# Patient Record
Sex: Female | Born: 1970
Health system: Southern US, Community
[De-identification: ages and names within clinical notes are randomized; demographics above are authoritative.]

## PROBLEM LIST (undated history)

## (undated) DIAGNOSIS — E042 Nontoxic multinodular goiter: Secondary | ICD-10-CM

## (undated) DIAGNOSIS — K219 Gastro-esophageal reflux disease without esophagitis: Secondary | ICD-10-CM

## (undated) DIAGNOSIS — IMO0001 Reserved for inherently not codable concepts without codable children: Secondary | ICD-10-CM

## (undated) DIAGNOSIS — Z8669 Personal history of other diseases of the nervous system and sense organs: Secondary | ICD-10-CM

## (undated) DIAGNOSIS — R030 Elevated blood-pressure reading, without diagnosis of hypertension: Secondary | ICD-10-CM

## (undated) DIAGNOSIS — Z9889 Other specified postprocedural states: Secondary | ICD-10-CM

## (undated) HISTORY — DX: Gastro-esophageal reflux disease without esophagitis: K21.9

## (undated) HISTORY — DX: Elevated blood-pressure reading, without diagnosis of hypertension: R03.0

## (undated) HISTORY — PX: BREAST SURGERY: SHX581

## (undated) HISTORY — PX: OTHER SURGICAL HISTORY: SHX169

## (undated) HISTORY — DX: Personal history of other diseases of the nervous system and sense organs: Z86.69

## (undated) HISTORY — DX: Reserved for inherently not codable concepts without codable children: IMO0001

## (undated) HISTORY — DX: Nontoxic multinodular goiter: E04.2

---

## 2000-12-19 ENCOUNTER — Other Ambulatory Visit: Admission: RE | Admit: 2000-12-19 | Discharge: 2000-12-19 | Payer: Self-pay | Admitting: Obstetrics and Gynecology

## 2001-08-19 ENCOUNTER — Inpatient Hospital Stay (HOSPITAL_COMMUNITY): Admission: AD | Admit: 2001-08-19 | Discharge: 2001-08-19 | Payer: Self-pay | Admitting: Obstetrics and Gynecology

## 2001-08-23 ENCOUNTER — Inpatient Hospital Stay (HOSPITAL_COMMUNITY): Admission: AD | Admit: 2001-08-23 | Discharge: 2001-08-26 | Payer: Self-pay | Admitting: Obstetrics and Gynecology

## 2001-11-26 ENCOUNTER — Other Ambulatory Visit: Admission: RE | Admit: 2001-11-26 | Discharge: 2001-11-26 | Payer: Self-pay | Admitting: Obstetrics and Gynecology

## 2002-09-08 ENCOUNTER — Encounter: Payer: Self-pay | Admitting: Obstetrics and Gynecology

## 2002-09-08 ENCOUNTER — Encounter: Admission: RE | Admit: 2002-09-08 | Discharge: 2002-09-08 | Payer: Self-pay | Admitting: Obstetrics and Gynecology

## 2003-12-24 ENCOUNTER — Other Ambulatory Visit: Admission: RE | Admit: 2003-12-24 | Discharge: 2003-12-24 | Payer: Self-pay | Admitting: Obstetrics and Gynecology

## 2005-01-04 ENCOUNTER — Ambulatory Visit: Payer: Self-pay | Admitting: Internal Medicine

## 2005-12-25 ENCOUNTER — Encounter (INDEPENDENT_AMBULATORY_CARE_PROVIDER_SITE_OTHER): Payer: Self-pay | Admitting: Internal Medicine

## 2005-12-25 LAB — CONVERTED CEMR LAB

## 2006-10-01 ENCOUNTER — Ambulatory Visit: Payer: Self-pay | Admitting: Family Medicine

## 2006-10-01 LAB — CONVERTED CEMR LAB
ALT: 10 units/L (ref 0–40)
AST: 14 units/L (ref 0–37)
Albumin: 4 g/dL (ref 3.5–5.2)
Alkaline Phosphatase: 40 units/L (ref 39–117)
BUN: 6 mg/dL (ref 6–23)
Basophils Absolute: 0.1 10*3/uL (ref 0.0–0.1)
Basophils Relative: 0.9 % (ref 0.0–1.0)
Bilirubin, Direct: 0.1 mg/dL (ref 0.0–0.3)
CO2: 28 meq/L (ref 19–32)
Calcium: 9.2 mg/dL (ref 8.4–10.5)
Chloride: 107 meq/L (ref 96–112)
Cholesterol: 148 mg/dL (ref 0–200)
Creatinine, Ser: 1 mg/dL (ref 0.4–1.2)
Eosinophils Absolute: 0.1 10*3/uL (ref 0.0–0.6)
Eosinophils Relative: 1.2 % (ref 0.0–5.0)
GFR calc Af Amer: 82 mL/min
GFR calc non Af Amer: 68 mL/min
Glucose, Bld: 83 mg/dL (ref 70–99)
HCT: 37.3 % (ref 36.0–46.0)
HDL: 43.6 mg/dL (ref >39.0)
Hemoglobin: 12.4 g/dL (ref 12.0–15.0)
LDL Cholesterol: 97 mg/dL (ref 0–99)
Lymphocytes Relative: 31.9 % (ref 12.0–46.0)
MCHC: 33.3 g/dL (ref 30.0–36.0)
MCV: 82.9 fL (ref 78.0–100.0)
Monocytes Absolute: 0.4 10*3/uL (ref 0.2–0.7)
Monocytes Relative: 5 % (ref 3.0–11.0)
Neutro Abs: 5.1 10*3/uL (ref 1.4–7.7)
Neutrophils Relative %: 61 % (ref 43.0–77.0)
Platelets: 191 10*3/uL (ref 150–400)
Potassium: 4.1 meq/L (ref 3.5–5.1)
RBC: 4.5 M/uL (ref 3.87–5.11)
RDW: 12.8 % (ref 11.5–14.6)
Sodium: 139 meq/L (ref 135–145)
TSH: 0.96 microintl units/mL (ref 0.35–5.50)
Total Bilirubin: 1 mg/dL (ref 0.3–1.2)
Total CHOL/HDL Ratio: 3.4
Total Protein: 7.3 g/dL (ref 6.0–8.3)
Triglycerides: 39 mg/dL (ref 0–149)
VLDL: 8 mg/dL (ref 0–40)
WBC: 8.3 10*3/uL (ref 4.5–10.5)

## 2007-10-22 ENCOUNTER — Ambulatory Visit: Payer: Self-pay | Admitting: Internal Medicine

## 2007-10-23 ENCOUNTER — Ambulatory Visit: Payer: Self-pay | Admitting: Internal Medicine

## 2008-12-14 ENCOUNTER — Encounter (INDEPENDENT_AMBULATORY_CARE_PROVIDER_SITE_OTHER): Payer: Self-pay | Admitting: Internal Medicine

## 2008-12-14 DIAGNOSIS — Z8669 Personal history of other diseases of the nervous system and sense organs: Secondary | ICD-10-CM | POA: Insufficient documentation

## 2008-12-15 ENCOUNTER — Ambulatory Visit: Payer: Self-pay | Admitting: Family Medicine

## 2008-12-16 LAB — CONVERTED CEMR LAB
ALT: 13 units/L (ref 0–35)
AST: 17 units/L (ref 0–37)
Albumin: 3.9 g/dL (ref 3.5–5.2)
Alkaline Phosphatase: 48 units/L (ref 39–117)
BUN: 9 mg/dL (ref 6–23)
Basophils Absolute: 0.1 10*3/uL (ref 0.0–0.1)
Basophils Relative: 1 % (ref 0.0–3.0)
Bilirubin, Direct: 0.1 mg/dL (ref 0.0–0.3)
CO2: 29 meq/L (ref 19–32)
Calcium: 9.3 mg/dL (ref 8.4–10.5)
Chloride: 109 meq/L (ref 96–112)
Cholesterol: 164 mg/dL (ref 0–200)
Creatinine, Ser: 0.9 mg/dL (ref 0.4–1.2)
Eosinophils Absolute: 0.1 10*3/uL (ref 0.0–0.7)
Eosinophils Relative: 1.4 % (ref 0.0–5.0)
GFR calc non Af Amer: 91.42 mL/min (ref >60)
Glucose, Bld: 85 mg/dL (ref 70–99)
HCT: 39.4 % (ref 36.0–46.0)
HDL: 54.3 mg/dL (ref >39.00)
Hemoglobin: 12.6 g/dL (ref 12.0–15.0)
LDL Cholesterol: 100 mg/dL — ABNORMAL HIGH (ref 0–99)
Lymphocytes Relative: 36.5 % (ref 12.0–46.0)
Lymphs Abs: 2.4 10*3/uL (ref 0.7–4.0)
MCHC: 32.1 g/dL (ref 30.0–36.0)
MCV: 85.4 fL (ref 78.0–100.0)
Monocytes Absolute: 0.3 10*3/uL (ref 0.1–1.0)
Monocytes Relative: 4.3 % (ref 3.0–12.0)
Neutro Abs: 3.8 10*3/uL (ref 1.4–7.7)
Neutrophils Relative %: 56.8 % (ref 43.0–77.0)
Platelets: 158 10*3/uL (ref 150.0–400.0)
Potassium: 4.3 meq/L (ref 3.5–5.1)
RBC: 4.61 M/uL (ref 3.87–5.11)
RDW: 12.8 % (ref 11.5–14.6)
Sodium: 142 meq/L (ref 135–145)
TSH: 0.79 microintl units/mL (ref 0.35–5.50)
Total Bilirubin: 0.9 mg/dL (ref 0.3–1.2)
Total CHOL/HDL Ratio: 3
Total Protein: 7.3 g/dL (ref 6.0–8.3)
Triglycerides: 48 mg/dL (ref 0.0–149.0)
VLDL: 9.6 mg/dL (ref 0.0–40.0)
WBC: 6.7 10*3/uL (ref 4.5–10.5)

## 2011-01-12 NOTE — Assessment & Plan Note (Signed)
[facility]                           [facility] OFFICE NOTE   NAME:Monique Fletcher                        MRN:          [id number]  DATE:10/01/2006                            DOB:          07/21/1971    [name] is a 40 year old black female who comes to establish with  the practice due to constipation and recent slight weight gain of 3-5  pounds in the last year.  She indicates that she exercises on a regular  basis, but has continued to gain weight.   She also has had no primary care physician in years and is in need of  establishing.   CURRENT MEDICATIONS:  1. Benefiber 3 tablets b.i.d.  2. Dunc cream for acne.   ALLERGIES:  1. SULFUR causes and facial swelling.  2. FLAGYL causes nausea and vomiting.   PAST MEDICAL HISTORY:  1. She indicates that she was preeclamptic with elevated blood      pressure for 1 month at the end of her pregnancy.  This, however,      resolved after pregnancy was completed.  2. She has fibrocystic breast disease.  3. Migraines.  The last one was in 2002.   SURGERIES:  1. Two D&Cs, one in September of 2001 and one in January of 2002.  2. She has had wisdom teeth extracted.  3. A left breast mass removed in 1988 which was benign.   She has been hospitalized for surgery and childbirth.   SOCIAL HISTORY:  She is single and has 1 daughter, age 65.  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  She  does cardio 3-4 times a week.  Has never smoked.  Does not use alcohol.   REVIEW OF SYSTEMS:  HEENT:  Negative with the exception of 2 episodes of  syncope in April of 2003.  She had a complete work-up including EKG, 2-D  echocardiogram and MRI of the brain, which was negative.  She does have  a history of migraines, as discussed above.  She wears glasses for near-  sightedness.  Her last visit to the eye doctor was approximately 2 years  ago, which was negative for glaucoma and  cataracts.  CARDIORESPIRATORY:  She denies any cardiovascular or respiratory problem, with the exception  of hypertension during her pregnancy.  GI:  She has a constipation.  She  was seen by Dr. Lina Sar a number of years ago, and currently is  using Benefiber, which helps.  She has reflux per her ENT.  She was  previously started on AcipHex, but currently is taking nothing.  She was  worked up for a lump in her throat.  She has had no transfusions.  She  has a tattoo on her left shoulder.  She had HIV testing for insurance  purposes in October of 2006, which was negative.  GU:  She has no  history of renal stones, but does have a history of frequent urinary  tract infections until the last year when she has only had one.  GYNECOLOGIC:  She is a gravida 1, para 1, with her last Pap smear done  May of 2007 by Dr. Billy Coast, whom she sees on a regular basis.  She had an  ultrasound of her breasts in 2004 due to her markedly fibrocystic  changes.  Eclampsia/preeclampsia as discussed above.  MUSCULOSKELETAL:  She has pain in her ankles and knees with exercise.  Has had no  fractures.  She does have allergic rhinitis and uses either Benadryl or  Zyrtec.  She has had no thromboses.   FAMILY HISTORY:  Father is living at age 81, having had a partial  colectomy due to diverticulum.  Mother is living at age 74 with no known  medical problems.  She has 1 brother living and well.   With questions regarding the extended family, it was found that her  paternal grandmother had a stroke.  Her maternal grandmother had  hypertension, and her maternal grandfather died of a stroke.  Her  paternal grandmother has diabetes.  Her paternal great-grandmother had  breast cancer, but to her knowledge, there is no further cancer in the  family.  Her paternal grandmother had depression.  There is no drug or  alcohol abuse in the family.   IMMUNIZATION RECORD:  Her last tetanus was in 2005.  She has had the   hepatitis B vaccine, but never had the pneumonia vaccine.   PHYSICAL EXAMINATION:  VITAL SIGNS:  Blood pressure 107/72, temperature  98.2, pulse 68, platelets 135, height 5 feet.  GENERAL:  Well-developed, well-nourished, black female in no acute  distress.  CHEST:  Clear throughout.  No rales, rhonchi or wheezes.  HEART:  Rate and rhythm regular without murmurs, gallops, or rubs.  No  carotid bruits.  No pretibial edema.  MUSCULOSKELETAL:  Muscles masses equal in upper and lower extremities.  Gait and station within normal limits.  Strength is 5/5 in upper and  lower extremities.  SKIN:  Without obvious lesion in exposed areas.  PSYCHIATRIC:  Oriented x3.  Verbalizes very easily.   ASSESSMENT:  1. History of constipation, currently fairly well controlled on her      Benefiber.  I have asked her to increase her water or juice in      order to keep bowel movements soft.  She generally has 1 bowel      movement 2-3 times a week.  2. Slight weight gain over the past year.  Reviewed with her reduction      in calories (i.e. processed foods).  Will get a [place] panel,      which is non-fasting, today and notify of      results.  3. Schedule in for CPX.      Billie D. Bean, FNP  Electronically Signed      Arta Silence, MD  Electronically Signed   BDB/MedQ  DD: 10/02/2006  DT: 10/02/2006  Job #: [id number]

## 2013-06-03 ENCOUNTER — Emergency Department (HOSPITAL_COMMUNITY): Payer: Self-pay

## 2013-06-03 ENCOUNTER — Emergency Department (HOSPITAL_COMMUNITY)
Admission: EM | Admit: 2013-06-03 | Discharge: 2013-06-04 | Disposition: A | Discharge status: Home / Self Care | Payer: Self-pay | Attending: Emergency Medicine | Admitting: Emergency Medicine

## 2013-06-03 ENCOUNTER — Encounter (HOSPITAL_COMMUNITY): Payer: Self-pay | Admitting: Emergency Medicine

## 2013-06-03 DIAGNOSIS — S81009A Unspecified open wound, unspecified knee, initial encounter: Secondary | ICD-10-CM | POA: Insufficient documentation

## 2013-06-03 DIAGNOSIS — Y9302 Activity, running: Secondary | ICD-10-CM | POA: Insufficient documentation

## 2013-06-03 DIAGNOSIS — S8990XA Unspecified injury of unspecified lower leg, initial encounter: Secondary | ICD-10-CM | POA: Insufficient documentation

## 2013-06-03 DIAGNOSIS — M25562 Pain in left knee: Secondary | ICD-10-CM

## 2013-06-03 DIAGNOSIS — Y9289 Other specified places as the place of occurrence of the external cause: Secondary | ICD-10-CM | POA: Insufficient documentation

## 2013-06-03 DIAGNOSIS — W010XXA Fall on same level from slipping, tripping and stumbling without subsequent striking against object, initial encounter: Secondary | ICD-10-CM | POA: Insufficient documentation

## 2013-06-03 DIAGNOSIS — T07XXXA Unspecified multiple injuries, initial encounter: Secondary | ICD-10-CM

## 2013-06-03 DIAGNOSIS — Z23 Encounter for immunization: Secondary | ICD-10-CM | POA: Insufficient documentation

## 2013-06-03 DIAGNOSIS — S81012A Laceration without foreign body, left knee, initial encounter: Secondary | ICD-10-CM

## 2013-06-03 DIAGNOSIS — W19XXXA Unspecified fall, initial encounter: Secondary | ICD-10-CM

## 2013-06-03 DIAGNOSIS — Z79899 Other long term (current) drug therapy: Secondary | ICD-10-CM | POA: Insufficient documentation

## 2013-06-03 DIAGNOSIS — IMO0002 Reserved for concepts with insufficient information to code with codable children: Secondary | ICD-10-CM | POA: Insufficient documentation

## 2013-06-03 MED ORDER — TETANUS-DIPHTH-ACELL PERTUSSIS 5-2.5-18.5 LF-MCG/0.5 IM SUSP
0.5000 mL | Freq: Once | INTRAMUSCULAR | Status: AC
  Administered 2013-06-03: 0.5 mL via INTRAMUSCULAR
  Filled 2013-06-03: qty 0.5

## 2013-06-03 NOTE — ED Notes (Addendum)
Presents post fall while running, denies syncope. Abrasion to right side of chin, left knee avulsion and abrasion, bleeding controlled, CMS intact. Tetanus not up to date. No deformity to knee.  Denies neck pain, denies headache

## 2013-06-03 NOTE — ED Provider Notes (Signed)
CSN: [id number]     Arrival date & time 06/03/13  2022 History   First MD Initiated Contact with Patient 06/03/13 2246     Chief Complaint  Patient presents with  . Fall   (Consider location/radiation/quality/duration/timing/severity/associated sxs/prior Treatment) HPI Comments: Monique Fletcher is a 42 y.o. female  with no known medical history presents to the Emergency Department complaining of acute, persistent, laceration to the left knee with associated abrasions and swelling to the left knee onset 745 this evening. Patient reports she was "racing" her daughter in the parking lot and tripped and fell. She states she landed on her left knee first, cough herself with her hands and then struck her face on the ground. She reports she did not have a loss of consciousness. She denies broken teeth, headache, neck pain, back pain. She states she was ambulatory immediately afterwards without difficulty. After she arrived home and look at her wounds she was concerned of a laceration on the left knee which needed stitches, bringing her into the department.  Walking makes the pain slightly worse and she has not tried any over-the-counter treatments to alleviate the pain.  Patient rates her pain as mild.  She denies, chills, nausea, vomiting.  Patient is a 42 y.o. female presenting with fall. The history is provided by the patient and medical records. No language interpreter was used.  Fall This is a new problem. The current episode started today. The problem occurs rarely. The problem has been unchanged. Associated symptoms include arthralgias. Pertinent negatives include no fever, joint swelling, nausea, neck pain, numbness, vomiting or weakness. The symptoms are aggravated by bending. She has tried nothing for the symptoms.    History reviewed. No pertinent past medical history. History reviewed. No pertinent past surgical history. History reviewed. No pertinent family history. History  Substance Use  Topics  . Smoking status: Never Smoker   . Smokeless tobacco: Not on file  . Alcohol Use: Yes   OB History   Grav Para Term Preterm Abortions TAB SAB Ect Mult Living                 Review of Systems  Constitutional: Negative for fever.  HENT: Negative for dental problem, facial swelling and nosebleeds.   Gastrointestinal: Negative for nausea and vomiting.  Musculoskeletal: Positive for arthralgias. Negative for back pain, gait problem, joint swelling, neck pain and neck stiffness.  Skin: Positive for wound.  Allergic/Immunologic: Negative for immunocompromised state.  Neurological: Negative for weakness and numbness.  Hematological: Does not bruise/bleed easily.  Psychiatric/Behavioral: The patient is not nervous/anxious.     Allergies  Cephalexin; Metronidazole; and Sulfonamide derivatives  Home Medications   Current Outpatient Rx  Name  Route  Sig  Dispense  Refill  . fexofenadine (ALLEGRA) 180 MG tablet   Oral   Take 180 mg by mouth daily.         Marland Kitchen ibuprofen (ADVIL,MOTRIN) 200 MG tablet   Oral   Take 800 mg by mouth every 6 (six) hours as needed for pain.         . Multiple Vitamin (MULTIVITAMIN WITH MINERALS) TABS tablet   Oral   Take 1 tablet by mouth daily.          BP 124/66  Pulse 96  Temp(Src) 98.1 F (36.7 C) (Oral)  Resp 18  Wt 136 lb 5 oz (61.831 kg)  BMI 25.34 kg/m2  SpO2 100%  LMP 05/17/2013 Physical Exam  Nursing note and vitals reviewed. Constitutional: She  is oriented to person, place, and time. She appears well-developed and well-nourished. No distress.  HENT:  Head: Normocephalic.    Right Ear: Hearing, tympanic membrane, external ear and ear canal normal.  Left Ear: Hearing, tympanic membrane, external ear and ear canal normal.  Nose: Nose normal. No mucosal edema or rhinorrhea.  Mouth/Throat: Uvula is midline, oropharynx is clear and moist and mucous membranes are normal. Mucous membranes are not dry. Normal dentition. No  oropharyngeal exudate, posterior oropharyngeal edema, posterior oropharyngeal erythema or tonsillar abscesses.  No broken teeth Abrasion to the chin and right cheek  Eyes: Conjunctivae and EOM are normal. Pupils are equal, round, and reactive to light. No scleral icterus.  Neck: Normal range of motion and full passive range of motion without pain. No spinous process tenderness and no muscular tenderness present. No rigidity. Normal range of motion present.  Cardiovascular: Normal rate, regular rhythm, normal heart sounds and intact distal pulses.   No murmur heard. Capillary refill < 3 sec in the bilateral lower extremities  Pulmonary/Chest: Effort normal and breath sounds normal. No respiratory distress. She has no wheezes.  Musculoskeletal: Normal range of motion. She exhibits no edema.       Left knee: She exhibits ecchymosis and laceration. She exhibits normal range of motion, no swelling, no effusion, no deformity, no erythema, normal alignment and no LCL laxity. No tenderness found. No medial joint line and no lateral joint line tenderness noted.       Cervical back: Normal.       Thoracic back: Normal.       Lumbar back: Normal.  ROM: Full range of motion of the left hip, knee and ankle No joint line tenderness of the left knee  Lymphadenopathy:    She has no cervical adenopathy.  Neurological: She is alert and oriented to person, place, and time. She exhibits normal muscle tone. Coordination normal.  Sensation: Intact to dull and sharp in the bilateral lower extremities Strength: 5 out of 5 in the bilateral lower extremities including dorsiflexion and plantar flexion  Skin: Skin is warm and dry. She is not diaphoretic.  Abrasion to the left foot, bilateral hands and chin 3 x 3 cm laceration/avulsion to the left knee; no involvement of the joint capsule  Psychiatric: She has a normal mood and affect.    ED Course  LACERATION REPAIR Date/Time: 06/03/2013 11:36 PM Performed by:  Dierdre Forth Authorized by: Dierdre Forth Consent: Verbal consent obtained. Risks and benefits: risks, benefits and alternatives were discussed Consent given by: patient Patient understanding: patient states understanding of the procedure being performed Patient consent: the patient's understanding of the procedure matches consent given Procedure consent: procedure consent matches procedure scheduled Relevant documents: relevant documents present and verified Site marked: the operative site was marked Imaging studies: imaging studies available Required items: required blood products, implants, devices, and special equipment available Patient identity confirmed: verbally with patient and arm band Time out: Immediately prior to procedure a "time out" was called to verify the correct patient, procedure, equipment, support staff and site/side marked as required. Body area: lower extremity Location details: left knee Laceration length: 3 cm Contamination: The wound is contaminated. Foreign bodies: no foreign bodies Tendon involvement: none Nerve involvement: none Vascular damage: no Anesthesia: local infiltration Local anesthetic: lidocaine 2% with epinephrine Anesthetic total: 8 ml Patient sedated: no Preparation: Patient was prepped and draped in the usual sterile fashion. Irrigation solution: saline Irrigation method: syringe Amount of cleaning: extensive Debridement: moderate Degree of undermining:  none Skin closure: 3-0 Prolene Number of sutures: 3 Technique: horizontal mattress Approximation: close Approximation difficulty: complex Dressing: 4x4 sterile gauze and antibiotic ointment Patient tolerance: Patient tolerated the procedure well with no immediate complications.   (including critical care time) Labs Review Labs Reviewed - No data to display Imaging Review Dg Knee Complete 4 Views Left  06/03/2013   *RADIOLOGY REPORT*  Clinical Data: Status post  fall; left knee pain and abrasion.  LEFT KNEE - COMPLETE 4+ VIEW  Comparison: None.  Findings: There is no evidence of fracture or dislocation.  The joint spaces are preserved.  No significant degenerative change is seen; the patellofemoral joint is grossly unremarkable in appearance.  A fabella is noted.  No significant joint effusion is seen.  The visualized soft tissues are normal in appearance.  IMPRESSION: No evidence of fracture or dislocation.   Original Report Authenticated By: Tonia Ghent, M.D.    MDM   1. Laceration of left knee, initial encounter   2. Arthralgia of left knee   3. Abrasions of multiple sites   4. Fall while running, initial encounter      Monique Fletcher presents after fall with left knee pain and laceration.  Tdap booster given. Pressure irrigation performed along with debriedment. Laceration occurred < 8 hours prior to repair which was well tolerated. Pt has no co morbidities to effect normal wound healing. Discussed suture home care w pt and answered questions. Pt to f-u for wound check and suture removal in 7 days. Pt is hemodynamically stable w no complaints prior to dc.    Patient X-Ray of the left knee negative for obvious fracture or dislocation. I personally reviewed the imaging tests through PACS system.  I reviewed available ER/hospitalization records through the EMR.  Pt advised to follow up with orthopedics if symptoms persist for possibility of missed fracture diagnosis. Conservative therapy recommended and discussed. Patient will be dc home & is agreeable with above plan.  It has been determined that no acute conditions requiring further emergency intervention are present at this time. The patient/guardian have been advised of the diagnosis and plan. We have discussed signs and symptoms that warrant return to the ED, such as changes or worsening in symptoms.   Vital signs are stable at discharge.   BP 124/66  Pulse 96  Temp(Src) 98.1 F (36.7 C) (Oral)   Resp 18  Wt 136 lb 5 oz (61.831 kg)  BMI 25.34 kg/m2  SpO2 100%  LMP 05/17/2013  Patient/guardian has voiced understanding and agreed to follow-up with the PCP or specialist.    I personally performed the services described in this documentation, which was scribed in my presence. The recorded information has been reviewed and is accurate.      Dahlia Client Vici Novick, PA-C 06/03/13 2348

## 2013-06-03 NOTE — ED Provider Notes (Signed)
Medical screening examination/treatment/procedure(s) were performed by non-physician practitioner and as supervising physician I was immediately available for consultation/collaboration.   Charles B. Bernette Mayers, MD 06/03/13 2349

## 2013-06-13 ENCOUNTER — Emergency Department (INDEPENDENT_AMBULATORY_CARE_PROVIDER_SITE_OTHER)
Admission: EM | Admit: 2013-06-13 | Discharge: 2013-06-13 | Disposition: A | Discharge status: Home / Self Care | Payer: Self-pay | Source: Home / Self Care

## 2013-06-13 ENCOUNTER — Encounter (HOSPITAL_COMMUNITY): Payer: Self-pay | Admitting: Emergency Medicine

## 2013-06-13 DIAGNOSIS — Z4802 Encounter for removal of sutures: Secondary | ICD-10-CM

## 2013-06-13 NOTE — ED Provider Notes (Signed)
CSN: [id number]     Arrival date & time 06/13/13  0901 History   First MD Initiated Contact with Patient 06/13/13 [phone]     Chief Complaint  Patient presents with  . Wound Check   (Consider location/radiation/quality/duration/timing/severity/associated sxs/prior Treatment) HPI Comments: 42 year old female here for suture removal from a jagged laceration to the left knee approximately 10 days ago.   History reviewed. No pertinent past medical history. History reviewed. No pertinent past surgical history. No family history on file. History  Substance Use Topics  . Smoking status: Never Smoker   . Smokeless tobacco: Not on file  . Alcohol Use: Yes   OB History   Grav Para Term Preterm Abortions TAB SAB Ect Mult Living                 Review of Systems  All other systems reviewed and are negative.    Allergies  Cephalexin; Metronidazole; and Sulfonamide derivatives  Home Medications   Current Outpatient Rx  Name  Route  Sig  Dispense  Refill  . fexofenadine (ALLEGRA) 180 MG tablet   Oral   Take 180 mg by mouth daily.         Marland Kitchen ibuprofen (ADVIL,MOTRIN) 200 MG tablet   Oral   Take 800 mg by mouth every 6 (six) hours as needed for pain.         . Multiple Vitamin (MULTIVITAMIN WITH MINERALS) TABS tablet   Oral   Take 1 tablet by mouth daily.          BP 112/74  Pulse 82  Temp(Src) 98.2 F (36.8 C) (Oral)  SpO2 100%  LMP 05/17/2013 Physical Exam  Nursing note and vitals reviewed. Constitutional: She is oriented to person, place, and time. She appears well-developed and well-nourished. No distress.  Neurological: She is alert and oriented to person, place, and time. She exhibits normal muscle tone.  Skin: Skin is warm and dry.  She has been applying Neosporin ointment on a daily basis. There is a approximately 3 cm x 3 cm area of pink tissue which is now scabbed over yet. There are 3 retaining Prolene sutures. No signs of infection. No redness, no drainage,     ED Course  SUTURE REMOVAL Date/Time: 06/13/2013 9:46 AM Performed by: Phineas Real, DAVID Authorized by: Clementeen Graham, S Consent: Verbal consent obtained. Risks and benefits: risks, benefits and alternatives were discussed Consent given by: patient Patient understanding: patient states understanding of the procedure being performed Body area: lower extremity Location details: left knee Wound Appearance: clean, pink and moist Sutures Removed: 3 Post-removal: dressing applied Patient tolerance: Patient tolerated the procedure well with no immediate complications.   (including critical care time) Labs Review Labs Reviewed - No data to display Imaging Review No results found.    MDM   1. Visit for suture removal    All 3 sutures are moved and intact. The patient may clean with soap and water but can make sure the wound is dry. Do not apply any more ointments or creams. All L. wound to scab over and heal. For any development of signs of infection recheck promptly. Dressing is applied.  Hayden Rasmussen, NP 06/13/13 [phone]  Medical screening examination/treatment/procedure(s) were performed by a resident physician or non-physician practitioner and as the supervising physician I was immediately available for consultation/collaboration.  Clementeen Graham, MD    Rodolph Bong, MD 06/15/13 [phone]

## 2013-06-13 NOTE — ED Notes (Signed)
Seen by Hayden Rasmussen np

## 2014-04-02 ENCOUNTER — Encounter (INDEPENDENT_AMBULATORY_CARE_PROVIDER_SITE_OTHER): Payer: Self-pay

## 2014-04-02 ENCOUNTER — Ambulatory Visit (INDEPENDENT_AMBULATORY_CARE_PROVIDER_SITE_OTHER): Payer: 59 | Admitting: Family Medicine

## 2014-04-02 ENCOUNTER — Encounter: Payer: Self-pay | Admitting: Family Medicine

## 2014-04-02 VITALS — BP 100/70 | HR 70 | Temp 98.3°F | Ht 61.5 in | Wt 141.5 lb

## 2014-04-02 DIAGNOSIS — K219 Gastro-esophageal reflux disease without esophagitis: Secondary | ICD-10-CM

## 2014-04-02 DIAGNOSIS — Z13220 Encounter for screening for lipoid disorders: Secondary | ICD-10-CM

## 2014-04-02 DIAGNOSIS — G43909 Migraine, unspecified, not intractable, without status migrainosus: Secondary | ICD-10-CM

## 2014-04-02 DIAGNOSIS — E041 Nontoxic single thyroid nodule: Secondary | ICD-10-CM

## 2014-04-02 DIAGNOSIS — E042 Nontoxic multinodular goiter: Secondary | ICD-10-CM | POA: Insufficient documentation

## 2014-04-02 NOTE — Progress Notes (Signed)
Pre visit review using our clinic review tool, if applicable. No additional management support is needed unless otherwise documented below in the visit note. 

## 2014-04-02 NOTE — Patient Instructions (Signed)
Schedule fasting labs in next week. Stop at front desk to set up thyroid US. Continue working on healthy eating and regular exercise.

## 2014-04-02 NOTE — Assessment & Plan Note (Signed)
Resolved . None in last 10 years.

## 2014-04-02 NOTE — Assessment & Plan Note (Signed)
Palpated on exam over 1 year ago. No work up done. None felt now. Pt asymptomatic . Will eval TSH and send for thyroid US to eval.

## 2014-04-02 NOTE — Assessment & Plan Note (Signed)
No issues in last 13 years.

## 2014-04-02 NOTE — Progress Notes (Signed)
   Subjective:    Patient ID: Monique Fletcher, female    DOB: [birth year], 43 y.o.   MRN: [id number]  HPI  43 year old female presents to re-establish.  She saw Billie bean in past.   She sees Dr. Billy Coastaavon for GYN.  Last pap/DVE: 08/2012. TSH was nml in past. At that time he noted thyroid nodules. She never had this looked into.  Mild fatigue,  No temp instability No swelling No constipation No hair loss. No dry skin No palpitations.  BP Readings from Last 3 Encounters:  04/02/14 100/70  06/13/13 112/74  06/04/13 114/81         Review of Systems  Constitutional: Positive for fatigue. Negative for fever and unexpected weight change.  HENT: Negative for congestion, ear pain, sinus pressure, sneezing, sore throat and trouble swallowing.   Eyes: Negative for pain and itching.  Respiratory: Negative for cough, shortness of breath and wheezing.   Cardiovascular: Negative for chest pain, palpitations and leg swelling.  Gastrointestinal: Negative for nausea, abdominal pain, diarrhea, constipation and blood in stool.  Endocrine: Negative for cold intolerance and heat intolerance.  Genitourinary: Negative for dysuria, hematuria, vaginal discharge, difficulty urinating and menstrual problem.  Skin: Negative for rash.  Neurological: Negative for syncope, weakness, light-headedness, numbness and headaches.  Psychiatric/Behavioral: Negative for confusion and dysphoric mood. The patient is not nervous/anxious.        Objective:   Physical Exam  Constitutional: Vital signs are normal. She appears well-developed and well-nourished. She is cooperative.  Non-toxic appearance. She does not appear ill. No distress.  HENT:  Head: Normocephalic.  Right Ear: Hearing, tympanic membrane, external ear and ear canal normal.  Left Ear: Hearing, tympanic membrane, external ear and ear canal normal.  Nose: Nose normal.  Eyes: Conjunctivae, EOM and lids are normal. Pupils are equal, round, and  reactive to light. Lids are everted and swept, no foreign bodies found.  Neck: Trachea normal and normal range of motion. Neck supple. Carotid bruit is not present. No mass and no thyromegaly present.  No thyroid nodules palpated  Cardiovascular: Normal rate, regular rhythm, S1 normal, S2 normal, normal heart sounds and intact distal pulses.  Exam reveals no gallop.   No murmur heard. Pulmonary/Chest: Effort normal and breath sounds normal. No respiratory distress. She has no wheezes. She has no rhonchi. She has no rales.  Abdominal: Soft. Normal appearance and bowel sounds are normal. She exhibits no distension, no fluid wave, no abdominal bruit and no mass. There is no hepatosplenomegaly. There is no tenderness. There is no rebound, no guarding and no CVA tenderness. No hernia.  Lymphadenopathy:    She has no cervical adenopathy.    She has no axillary adenopathy.  Neurological: She is alert. She has normal strength. No cranial nerve deficit or sensory deficit.  Skin: Skin is warm, dry and intact. No rash noted.  Psychiatric: Her speech is normal and behavior is normal. Judgment normal. Her mood appears not anxious. Cognition and memory are normal. She does not exhibit a depressed mood.          Assessment & Plan:

## 2014-04-07 ENCOUNTER — Other Ambulatory Visit (INDEPENDENT_AMBULATORY_CARE_PROVIDER_SITE_OTHER): Payer: 59

## 2014-04-07 DIAGNOSIS — E041 Nontoxic single thyroid nodule: Secondary | ICD-10-CM

## 2014-04-07 DIAGNOSIS — Z13220 Encounter for screening for lipoid disorders: Secondary | ICD-10-CM

## 2014-04-07 LAB — COMPREHENSIVE METABOLIC PANEL
ALBUMIN: 3.9 g/dL (ref 3.5–5.2)
ALK PHOS: 41 U/L (ref 39–117)
ALT: 13 U/L (ref 0–35)
AST: 15 U/L (ref 0–37)
BILIRUBIN TOTAL: 1 mg/dL (ref 0.2–1.2)
BUN: 12 mg/dL (ref 6–23)
CO2: 27 mEq/L (ref 19–32)
CREATININE: 1 mg/dL (ref 0.4–1.2)
Calcium: 9.3 mg/dL (ref 8.4–10.5)
Chloride: 105 mEq/L (ref 96–112)
GFR: 77.79 mL/min (ref >60.00)
Glucose, Bld: 83 mg/dL (ref 70–99)
POTASSIUM: 3.8 meq/L (ref 3.5–5.1)
Sodium: 138 mEq/L (ref 135–145)
Total Protein: 7.1 g/dL (ref 6.0–8.3)

## 2014-04-07 LAB — LIPID PANEL
CHOLESTEROL: 158 mg/dL (ref 0–200)
HDL: 56.6 mg/dL (ref >39.00)
LDL CALC: 93 mg/dL (ref 0–99)
NonHDL: 101.4
TRIGLYCERIDES: 44 mg/dL (ref 0.0–149.0)
Total CHOL/HDL Ratio: 3
VLDL: 8.8 mg/dL (ref 0.0–40.0)

## 2014-04-07 LAB — TSH: TSH: 1.3 u[IU]/mL (ref 0.35–4.50)

## 2014-04-08 ENCOUNTER — Encounter: Payer: Self-pay | Admitting: *Deleted

## 2014-04-09 ENCOUNTER — Other Ambulatory Visit: Payer: 59

## 2014-04-12 ENCOUNTER — Ambulatory Visit
Admission: RE | Admit: 2014-04-12 | Discharge: 2014-04-12 | Disposition: A | Discharge status: Home / Self Care | Payer: 59 | Source: Ambulatory Visit | Attending: Family Medicine | Admitting: Family Medicine

## 2014-04-12 DIAGNOSIS — E041 Nontoxic single thyroid nodule: Secondary | ICD-10-CM

## 2014-04-13 ENCOUNTER — Telehealth: Payer: Self-pay | Admitting: Family Medicine

## 2014-04-13 DIAGNOSIS — E042 Nontoxic multinodular goiter: Secondary | ICD-10-CM

## 2014-04-13 NOTE — Telephone Encounter (Signed)
Message copied by Excell SeltzerBEDSOLE, Katelinn Justice E on Tue Apr 13, 2014  4:26 PM ------      Message from: Desmond DikeKNIGHT, WAYNETTA H      Created: Tue Apr 13, 2014  4:08 PM       Spoke to pt and informed her of results and instruction. Pt states that she agrees to endo referral and prefers Dr [place]adke [place]. ------

## 2014-04-21 ENCOUNTER — Ambulatory Visit (INDEPENDENT_AMBULATORY_CARE_PROVIDER_SITE_OTHER): Payer: 59 | Admitting: Endocrinology

## 2014-04-21 ENCOUNTER — Encounter: Payer: Self-pay | Admitting: Endocrinology

## 2014-04-21 VITALS — BP 106/68 | HR 70 | Ht 61.5 in | Wt 140.0 lb

## 2014-04-21 DIAGNOSIS — E042 Nontoxic multinodular goiter: Secondary | ICD-10-CM

## 2014-04-21 NOTE — Progress Notes (Signed)
Pre visit review using our clinic review tool, if applicable. No additional management support is needed unless otherwise documented below in the visit note. 

## 2014-04-21 NOTE — Assessment & Plan Note (Addendum)
I reviewed the images of her thyroid ultrasound along with the patient. We discussed that thyroid nodules are common in the population and carry a 5-15% risk of thyroid cancer. she doesn't have the risk factors for thyroid cancer and hence is at standard risk for thyroid cancer. There is no noninvasive test to differentiate between benign and malignant nodules. We discussed about thyroid nodule FNA and possible pathology reports.  Based on her recent ultrasound, her dominant isthmic nodule does not meet the size criteria for biopsy and could be monitored with serial thyroid ultrasound in one year. She has several other smaller nodules, bilaterally, as well, that could be monitored as well.  She was asked to monitor for any enlargement in the region of thyroid or any compressive symptoms, in which case she will be be evaluated sooner.  Follow back in one year. She will be due for a repeat thyroid ultrasound at that time.

## 2014-04-21 NOTE — Patient Instructions (Signed)
Monitor symptoms for now. If notice a lump in throat, persistent change in voice, lumps in neck, pleas notify me sooner. Please return in 1 year.

## 2014-04-21 NOTE — Progress Notes (Signed)
Reason for  Visit-  Monique Fletcher is a 43 y.o.-year-old female, referred by her PCP Excell Seltzer, MD for evaluation for Multinodular goiter.  HPI-  The patient reports being diagnosed with a goiter and nodules since Jan 2014 by her GYN. She followed up with her primary care physician this year, however, no nodule was palpable on her examination. The patient underwent thyroid ultrasound for further evaluation.   her thyroid ultrasound was done on 04/12/2014  at [facility] imaging. It showed bilateral thyroid nodules, with the largest nodule measuring 1.2 by 0.5 x 1.1 cm in the isthmus. It is a mixed nodule without any suspicious features. There are multiple smaller nodules scattered throughout the left lobe with the largest left-sided Thyroid nodule measuring 0.9 cm. There are multiple smaller nodules in the right thyroid lobe with the largest Thyroid nodule measuring 0.6 cm. . There is no lymphadenopathy.  she has a family history of thyroid disorders in her maternal grandmother. The patient denies any family history of thyroid cancer or personal history of XRT to her neck area. The patient denies any previous personal history of thyroid dysfunction.   I reviewed patient's thyroid tests: Lab Results  Component Value Date   TSH 1.30 04/07/2014   TSH 0.79 12/15/2008   TSH 0.96 10/01/2006     Patient denies feeling nodules in neck, hoarseness, SOB with lying down. She does have mild occasional dysphagia, that predates her history of goiter. She associates her dysphagia, with GERD. The patient denies any symptoms of hypo-or hyperthyroidism, as detailed below.  I have reviewed the patient's past medical history, family and social history, surgical history, medications and allergies. I have independently reviewed the  Images of the thyroid US and results are summarized in HPI.   Past Medical History  Diagnosis Date  . GERD (gastroesophageal reflux disease)   . Hypertension   . Thyroid disease   .  Hx of migraines    Past Surgical History  Procedure Laterality Date  . Breast surgery     History   Social History  . Marital Status: Single    Spouse Name: N/A    Number of Children: N/A  . Years of Education: N/A   Occupational History  . Not on file.   Social History Main Topics  . Smoking status: Never Smoker   . Smokeless tobacco: Never Used  . Alcohol Use: 1.0 oz/week    2 drink(s) per week  . Drug Use: No  . Sexual Activity: No   Other Topics Concern  . Not on file   Social History Narrative   43 year old daughter   Single   HR for career   Exercise: minimal   Diet: good, lots of veggies, water   Current Outpatient Prescriptions on File Prior to Visit  Medication Sig Dispense Refill  . Multiple Vitamin (MULTIVITAMIN WITH MINERALS) TABS tablet Take 1 tablet by mouth daily.       No current facility-administered medications on file prior to visit.   Allergies  Allergen Reactions  . Cephalexin     REACTION: severe diarrhea  . Macrobid [Nitrofurantoin Monohyd Macro] Nausea And Vomiting  . Metronidazole Nausea And Vomiting    REACTION: nausea and vomiting  . Sulfonamide Derivatives Hives    REACTION: facial swelling   Family History  Problem Relation Age of Onset  . Hypertension Father   . Stroke Maternal Grandfather   . Stroke Paternal Grandmother   . Diabetes Paternal Grandmother   .  Diabetes Mother   . Sleep apnea Brother   . Thyroid disease Maternal Grandmother     Review of Systems- Review of Systems:  complains of  [  ] denies General:   [  ] Recent weight change [  ] Fatigue  [  ] Loss of appetite Eyes: [  ]  Vision Difficulty [  ]  Eye pain ENT: [  ]  Hearing difficulty [ x ]  Difficulty Swallowing CVS: [  ] Chest pain [  ]  Palpitations/Irregular Heart beat [  ]  Shortness of breath lying flat [  ] Swelling of legs Resp: [  ] Frequent Cough [  ] Shortness of Breath  [  ]  Wheezing GI: [  ] Heartburn  [  ] Nausea or Vomiting  [  ]  Diarrhea [  ] Constipation  [  ] Abdominal Pain GU: [  ]  Polyuria  [  ]  nocturia Bones/joints:  [  ]  Muscle aches  [  ] Joint Pain  [  ] Bone pain Skin/Hair/Nails: [  ]  Rash  [  ] New stretch marks [  ]  Itching [  ] Hair loss [  ]  Excessive hair growth Reproduction: [  ] Low sexual desire , [  ]  Women: Menstrual cycle problems [  ]  Women: Breast Discharge [  ] Men: Difficulty with erections [  ]  Men: Enlarged Breasts CNS: [  ] Frequent Headaches [  ] Blurry vision [  ] Tremors [  ] Seizures [  ] Loss of consciousness [  ] Localized weakness Endocrine: [  ]  Excess thirst [  ]  Feeling excessively hot [  ]  Feeling excessively cold Heme: [  ]  Easy bruising [  ]  Enlarged glands or lumps in neck Allergy: [  ]  Food allergies [  ] Environmental allergies  Physical Exam- BP 106/68  Pulse 70  Ht 5' 1.5" (1.562 m)  Wt 140 lb (63.504 kg)  BMI 26.03 kg/m2  SpO2 98%  LMP 03/25/2014 Wt Readings from Last 3 Encounters:  04/21/14 140 lb (63.504 kg)  04/02/14 141 lb 8 oz (64.184 kg)  06/03/13 136 lb 5 oz (61.831 kg)    HEENT: Page/AT, EOMI, no icterus, no proptosis, no chemosis, no mild lid lag, no retraction, eyes close completely Neck: thyroid gland - smooth, mild enlargement, non-tender, no erythema, no tracheal deviation; negative Pemberton's sign; no lympahadenopathy; no bruits Lungs: good air entry, clear bilaterally Heart: S1&S2 normal, regular rate & rhythm; no murmurs, rubs or gallops Abd: soft, NT, ND, no HSM, +BS Ext: no tremor in hands bilaterally, no edema, 2+ DP/PT pulses, good muscle mass Neuro: normal gait, 2+ reflexes bilaterally, normal 5/5 strength, no proximal myopathy  Derm: no pretibial myxoedema/skin dryness  ASSESSMENT- Problem List Items Addressed This Visit     Endocrine   Nontoxic multinodular goiter - Primary     I reviewed the images of her thyroid ultrasound along with the patient. We discussed that thyroid nodules are common in the population and carry  a 5-15% risk of thyroid cancer. she doesn't have the risk factors for thyroid cancer and hence is at standard risk for thyroid cancer. There is no noninvasive test to differentiate between benign and malignant nodules. We discussed about thyroid nodule FNA and possible pathology reports.  Based on her recent ultrasound, her dominant isthmic nodule does not meet the size criteria for  biopsy and could be monitored with serial thyroid ultrasound in one year. She has several other smaller nodules, bilaterally, as well, that could be monitored as well.  She was asked to monitor for any enlargement in the region of thyroid or any compressive symptoms, in which case she will be be evaluated sooner.  Follow back in one year. She will be due for a repeat thyroid ultrasound at that time.

## 2014-05-11 ENCOUNTER — Encounter: Payer: Self-pay | Admitting: Family Medicine

## 2014-09-24 ENCOUNTER — Telehealth: Payer: Self-pay | Admitting: Family Medicine

## 2014-09-24 ENCOUNTER — Other Ambulatory Visit (INDEPENDENT_AMBULATORY_CARE_PROVIDER_SITE_OTHER): Payer: 59

## 2014-09-24 DIAGNOSIS — E042 Nontoxic multinodular goiter: Secondary | ICD-10-CM

## 2014-09-24 LAB — TSH: TSH: 0.882 u[IU]/mL (ref 0.350–4.500)

## 2014-09-24 NOTE — Telephone Encounter (Signed)
Left message for Monique Fletcher that she can come in today to have her thyroid check per Dr. Ermalene SearingBedsole.  I ask that she call back to schedule an appointment for the lab.

## 2014-09-24 NOTE — Telephone Encounter (Signed)
Pt is requesting to have her Thyroid levels checked again because she has been having symptoms. She is requesting to have this done today if possible. Is this ok?

## 2014-09-24 NOTE — Telephone Encounter (Signed)
Yes, order entered

## 2014-09-27 ENCOUNTER — Encounter: Payer: Self-pay | Admitting: *Deleted

## 2015-04-26 ENCOUNTER — Ambulatory Visit: Payer: 59 | Admitting: Endocrinology

## 2015-06-17 ENCOUNTER — Encounter: Payer: Self-pay | Admitting: Internal Medicine

## 2015-06-17 ENCOUNTER — Ambulatory Visit (INDEPENDENT_AMBULATORY_CARE_PROVIDER_SITE_OTHER): Payer: 59 | Admitting: Internal Medicine

## 2015-06-17 VITALS — BP 110/64 | HR 74 | Temp 98.4°F | Resp 12 | Ht 61.5 in | Wt 152.0 lb

## 2015-06-17 DIAGNOSIS — E042 Nontoxic multinodular goiter: Secondary | ICD-10-CM | POA: Diagnosis not present

## 2015-06-17 NOTE — Progress Notes (Signed)
Patient ID: Monique RobinsonsJoy T Tomassi, female   DOB: [birth year], 44 y.o.   MRN: [id number]   HPI  Monique Fletcher is a 44 y.o.-year-old female, referred by her PCP, Dr. Ermalene SearingBedsole, for management of multinodular goiter. He has been previously seen by Dr.Phadke, who since left the practice.  Patient was diagnosed with a nontoxic multinodular goiter in 08/2012 by her OB/GYN doctor (Dr Billy Coastaavon). She was referred to PCP, who ordered a thyroid ultrasound.  Thyroid ultrasound (04/12/2014): bilateral thyroid nodules, with the largest nodule measuring 1.2 x 0.5 x 1.1 cm in the isthmus - mixed nodule without any suspicious features. There are multiple smaller nodules scattered throughout the left lobe with the largest left-sided Thyroid nodule measuring 0.9 cm. There are multiple smaller nodules in the right thyroid lobe with the largest Thyroid nodule measuring 0.6 cm. No lymphadenopathy.    Pt denies - feeling nodules in neck - hoarseness - dysphagia - choking - SOB with lying down  I reviewed pt's thyroid tests: Lab Results  Component Value Date   TSH 0.882 09/24/2014   TSH 1.30 04/07/2014   TSH 0.79 12/15/2008   TSH 0.96 10/01/2006   11/13/2012: TPO Abs 0.00   Pt denies: - fatigue - heat intolerance/cold intolerance - tremors - palpitations - anxiety/depression - hyperdefecation/constipation - dry skin - hair loss  She c/o: - weight gain  She has a family history of thyroid disorders in her maternal grandmother. No family history of thyroid cancer or personal history of XRT to her neck area.   No seaweed or kelp. No recent contrast studies. No steroid use. No herbal supplements. No Biotin supplements or Hair, Skin and Nails vitamins.  ROS: Constitutional: no weight gain/loss, no fatigue, no subjective hyperthermia/hypothermia Eyes: no blurry vision, no xerophthalmia ENT: no sore throat, no nodules palpated in throat, no dysphagia/odynophagia, no hoarseness Cardiovascular: no  CP/SOB/palpitations/leg swelling Respiratory: no cough/SOB Gastrointestinal: no N/V/D/C Musculoskeletal: no muscle/joint aches Skin: no rashes Neurological: no tremors/numbness/tingling/dizziness Psychiatric: no depression/anxiety  Past Medical History  Diagnosis Date  . GERD (gastroesophageal reflux disease)   . Hypertension   . Thyroid disease   . Hx of migraines    Past Surgical History  Procedure Laterality Date  . Breast surgery     Social History   Social History  . Marital Status: Single    Spouse Name: N/A   Social History Main Topics  . Smoking status: Never Smoker   . Smokeless tobacco: Never Used  . Alcohol Use: 1.0 oz/week    2 drink(s) per week  . Drug Use: No  . Sexual Activity: No   Other Topics Concern  . Not on file   Social History Narrative   1 daughter   Single   HR for career   Exercise: minimal   Diet: good, lots of veggies, water   Current Outpatient Prescriptions on File Prior to Visit  Medication Sig Dispense Refill  . Multiple Vitamin (MULTIVITAMIN WITH MINERALS) TABS tablet Take 1 tablet by mouth daily.     No current facility-administered medications on file prior to visit.   Allergies  Allergen Reactions  . Cephalexin     REACTION: severe diarrhea  . Macrobid [Nitrofurantoin Monohyd Macro] Nausea And Vomiting  . Metronidazole Nausea And Vomiting    REACTION: nausea and vomiting  . Sulfonamide Derivatives Hives    REACTION: facial swelling   Family History  Problem Relation Age of Onset  . Hypertension Father   . Stroke Maternal Grandfather   .  Stroke Paternal Grandmother   . Diabetes Paternal Grandmother   . Diabetes Mother   . Sleep apnea Brother   . Thyroid disease Maternal Grandmother    PE: BP 110/64 mmHg  Pulse 74  Temp(Src) 98.4 F (36.9 C) (Oral)  Resp 12  Ht 5' 1.5" (1.562 m)  Wt 152 lb (68.947 kg)  BMI 28.26 kg/m2  SpO2 98% Wt Readings from Last 3 Encounters:  06/17/15 152 lb (68.947 kg)  04/21/14  140 lb (63.504 kg)  04/02/14 141 lb 8 oz (64.184 kg)   Constitutional: overweight, in NAD Eyes: PERRLA, EOMI, no exophthalmos ENT: moist mucous membranes, no thyromegaly, + palpable isthmus nodule, no cervical lymphadenopathy Cardiovascular: RRR, No MRG Respiratory: CTA B Gastrointestinal: abdomen soft, NT, ND, BS+ Musculoskeletal: no deformities, strength intact in all 4;  Skin: moist, warm, no rashes Neurological: no tremor with outstretched hands, DTR normal in all 4  ASSESSMENT: 1. MNG - thyroid U/S:  PLAN: 1. MNG  - I reviewed the images of her thyroid ultrasound from 03/2014 along with the patient. I pointed out that the dominant nodule is small, and not appearing worrisone - isoechoic - partly colloid-filled - without microcalcifications - without internal blood flow - more wide than tall - fairly delimited from surrounding tissue Pt does not have a thyroid cancer family history or a personal history of RxTx to head/neck. All these would favor benignity.  - there is no indication for thyroid biopsy (FNA) based on the appearance and size of the nodule. However, I suggested to recheck a new U/S now to check for growth. If significant growth (which I doubt), I will suggest Bx. I explained what the test entails. - I explained that if the nodule did not change U/S characteristics, we can continue to follow her clinically and see her back in 2 years.  - she should let me know if she develops neck compression symptoms before next visit - I advised pt to join my chart and I will send her the results through there   - time spent with the patient: 40 min, of which >50% was spent in obtaining information about her symptoms, reviewing her previous labs, evaluations, and treatments, counseling her about her condition (please see the discussed topics above), and developing a plan to further investigate it; she had a number of questions which I addressed.  CLINICAL DATA: Follow-up thyroid  nodule  EXAM: THYROID ULTRASOUND  TECHNIQUE: Ultrasound examination of the thyroid gland and adjacent soft tissues was performed.  COMPARISON: 04/12/2014  FINDINGS: Right thyroid lobe  Measurements: 5.5 x 1.7 x 2.0 cm. Right upper pole complex nodule measures 10 x 4 x 8 mm mm. Other tiny nodules are present.  Left thyroid lobe  Measurements: 4.6 x 1.4 x 1.7 cm. Multiple nodules are scattered throughout the left lobe. The largest is a complex nodule in the mid lobe measuring 10 x 5 x 7 mm. There is a lower pole complex nodule measuring 10 x 5 x 6 mm.  Isthmus  Thickness: 7 mm. Complex nodule measures 14 x 4 x 7 mm.  Lymphadenopathy  None visualized.  IMPRESSION: Multiple bilateral complex nodules are again noted and are not significantly changed. They all remain below measurement criteria for fine needle aspiration biopsy. Findings do not meet current SRU consensus criteria for biopsy. Follow-up by clinical exam is recommended. If patient has known risk factors for thyroid carcinoma, consider follow-up ultrasound in 12 months. If patient is clinically hyperthyroid, consider nuclear medicine thyroid uptake  and scan.Reference: Management of Thyroid Nodules Detected at [place]: Society of Radiologists in Ultrasound Consensus Conference Statement. Radiology 2005; [id number].   Electronically Signed By: Jolaine Click M.D. On: 06/24/2015 16:35

## 2015-06-17 NOTE — Patient Instructions (Signed)
We will call you with the thyroid U/S schedule.  Please return in 2 years.

## 2015-06-24 ENCOUNTER — Ambulatory Visit
Admission: RE | Admit: 2015-06-24 | Discharge: 2015-06-24 | Disposition: A | Discharge status: Home / Self Care | Payer: 59 | Source: Ambulatory Visit | Attending: Internal Medicine | Admitting: Internal Medicine

## 2015-06-24 IMAGING — CR DG KNEE COMPLETE 4+V*L*
4 series · 4 of 4 positions shown · non-contrast
Comparison: None.

CLINICAL DATA: Status post fall; left knee pain and abrasion.

LEFT KNEE - COMPLETE 4+ VIEW

[t knee ap left]
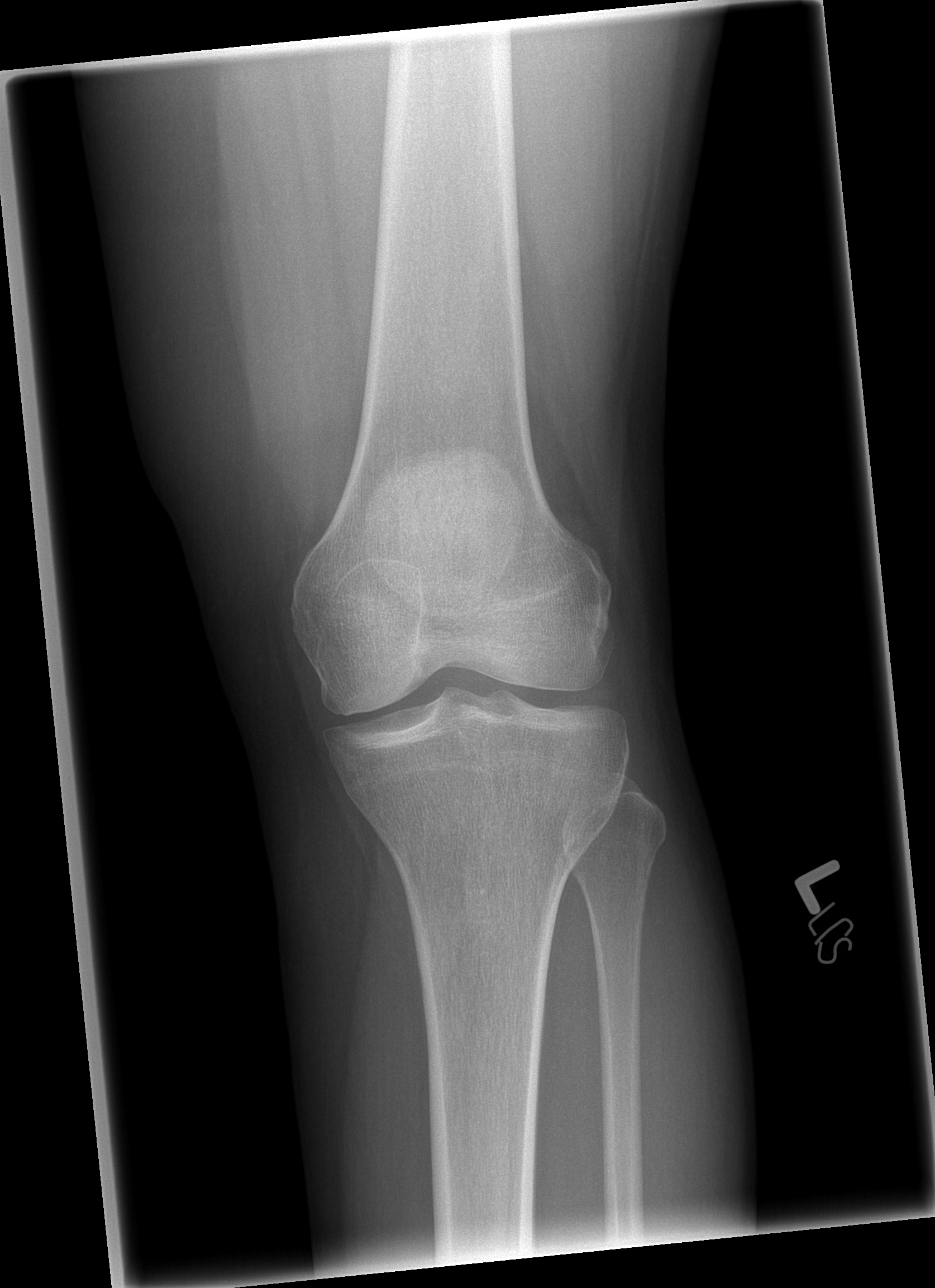

[t knee oblique left (1 of 2)]
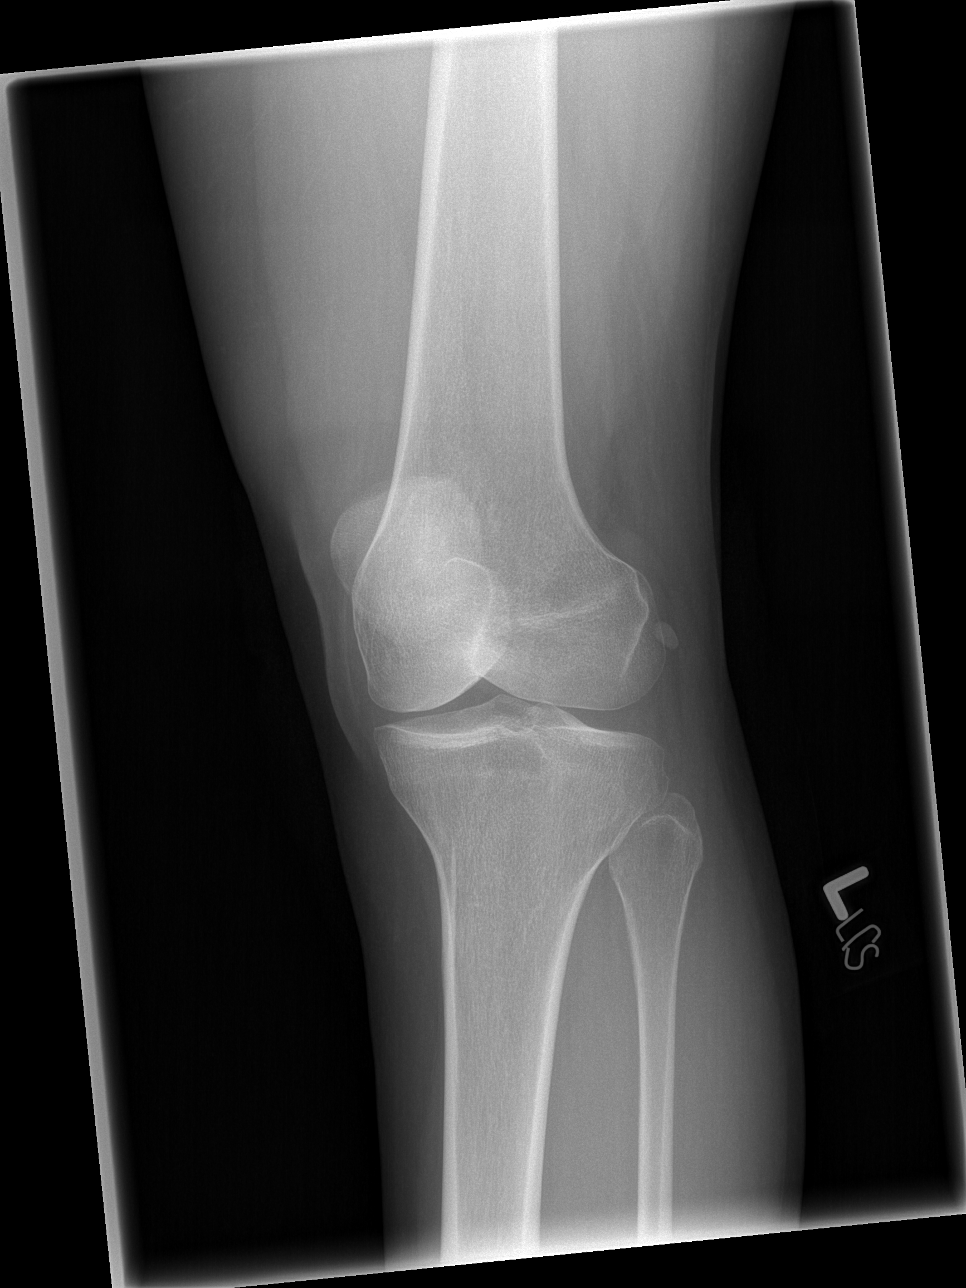

[t knee oblique left (2 of 2)]
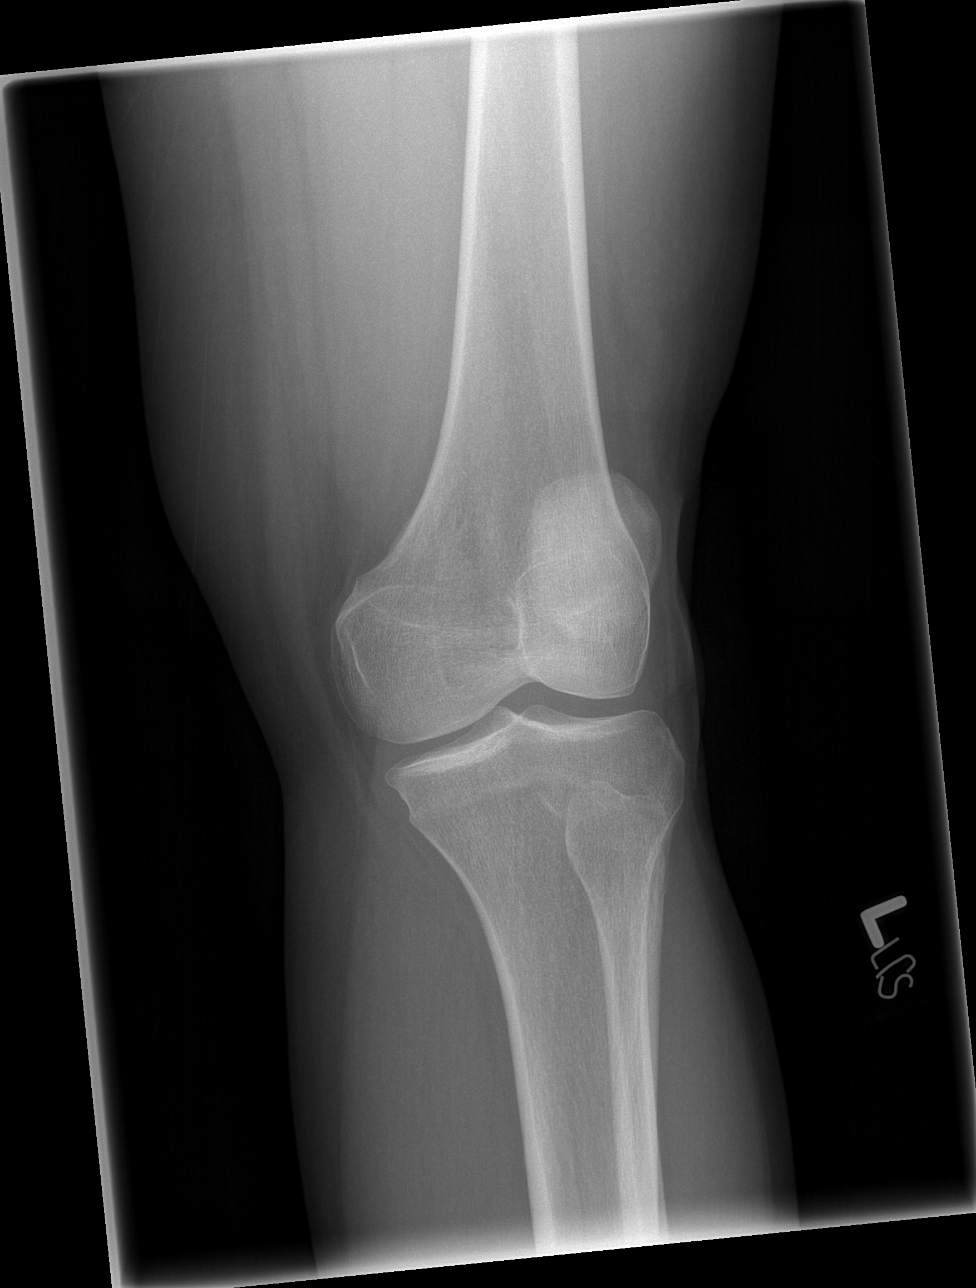

[t knee lat left]
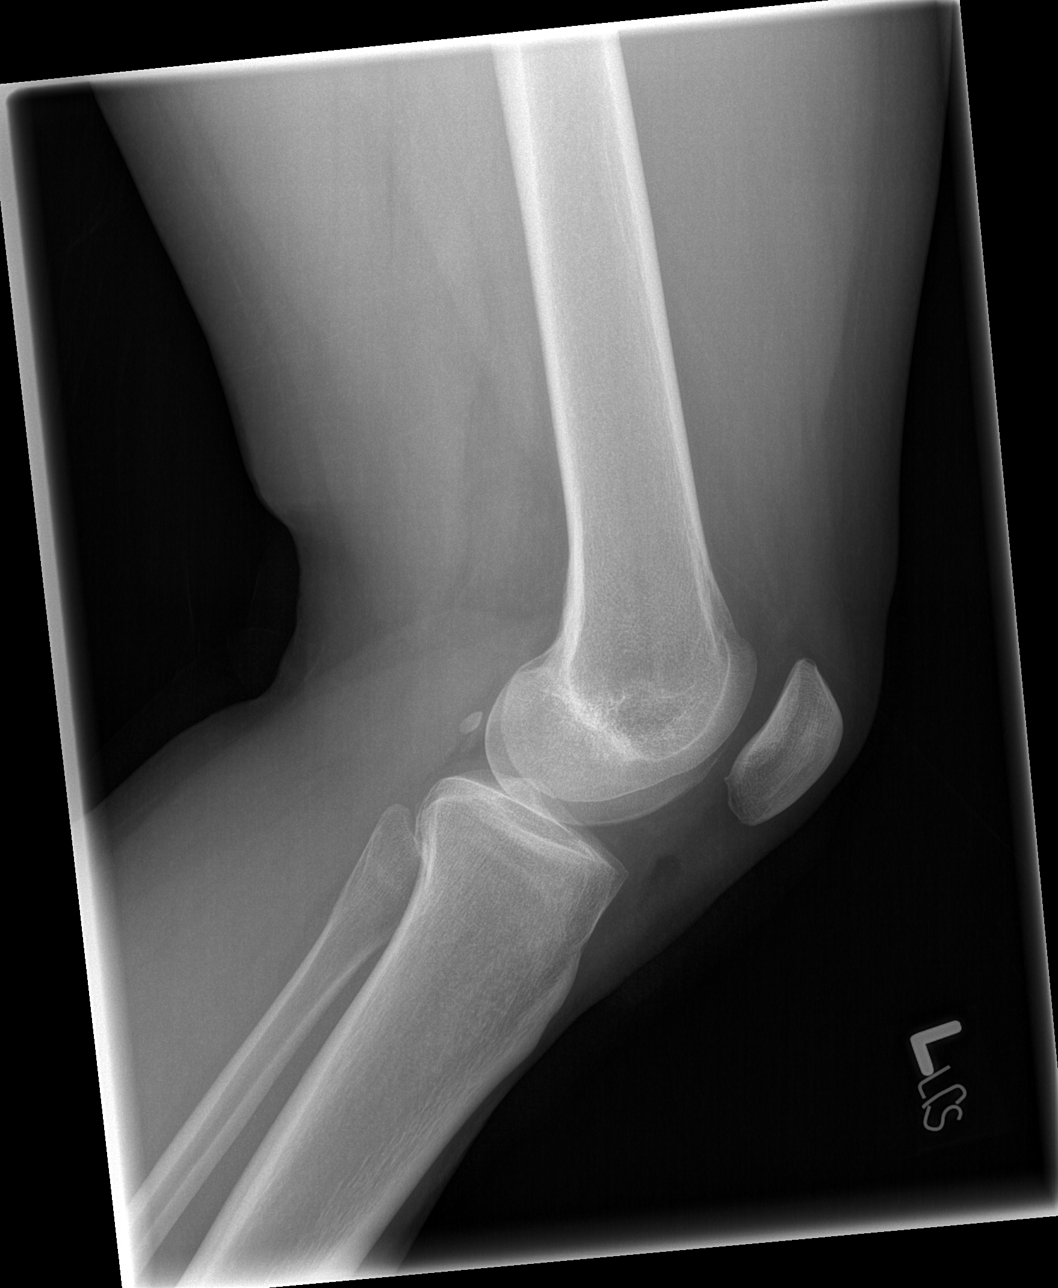

[4 of 4 positions shown; findings below may reference images not displayed]

FINDINGS: There is no evidence of fracture or dislocation.  The
joint spaces are preserved.  No significant degenerative change is
seen; the patellofemoral joint is grossly unremarkable in
appearance.  A fabella is noted.

No significant joint effusion is seen.  The visualized soft tissues
are normal in appearance.
IMPRESSION: No evidence of fracture or dislocation.

## 2015-10-21 ENCOUNTER — Other Ambulatory Visit (INDEPENDENT_AMBULATORY_CARE_PROVIDER_SITE_OTHER): Payer: 59

## 2015-10-21 ENCOUNTER — Other Ambulatory Visit: Payer: Self-pay | Admitting: *Deleted

## 2015-10-21 DIAGNOSIS — E042 Nontoxic multinodular goiter: Secondary | ICD-10-CM

## 2015-10-21 LAB — TSH: TSH: 0.63 u[IU]/mL (ref 0.35–4.50)

## 2016-02-16 ENCOUNTER — Encounter: Payer: Self-pay | Admitting: Gastroenterology

## 2016-02-24 ENCOUNTER — Ambulatory Visit: Payer: 59 | Admitting: Family Medicine

## 2016-02-24 ENCOUNTER — Encounter: Payer: Self-pay | Admitting: Family Medicine

## 2016-02-24 ENCOUNTER — Ambulatory Visit (INDEPENDENT_AMBULATORY_CARE_PROVIDER_SITE_OTHER): Payer: 59 | Admitting: Family Medicine

## 2016-02-24 VITALS — BP 120/80 | HR 74 | Ht 61.0 in | Wt 153.0 lb

## 2016-02-24 DIAGNOSIS — Z0000 Encounter for general adult medical examination without abnormal findings: Secondary | ICD-10-CM | POA: Diagnosis not present

## 2016-02-24 NOTE — Patient Instructions (Signed)
Follow up annually.  Please be sure to get your mammogram.  Take care  Dr. Adriana Simas   Health Maintenance, Female Adopting a healthy lifestyle and getting preventive care can go a long way to promote health and wellness. Talk with your health care provider about what schedule of regular examinations is right for you. This is a good chance for you to check in with your provider about disease prevention and staying healthy. In between checkups, there are plenty of things you can do on your own. Experts have done a lot of research about which lifestyle changes and preventive measures are most likely to keep you healthy. Ask your health care provider for more information. WEIGHT AND DIET  Eat a healthy diet  Be sure to include plenty of vegetables, fruits, low-fat dairy products, and lean protein.  Do not eat a lot of foods high in solid fats, added sugars, or salt.  Get regular exercise. This is one of the most important things you can do for your health.  Most adults should exercise for at least 150 minutes each week. The exercise should increase your heart rate and make you sweat (moderate-intensity exercise).  Most adults should also do strengthening exercises at least twice a week. This is in addition to the moderate-intensity exercise.  Maintain a healthy weight  Body mass index (BMI) is a measurement that can be used to identify possible weight problems. It estimates body fat based on height and weight. Your health care provider can help determine your BMI and help you achieve or maintain a healthy weight.  For females 87 years of age and older:   A BMI below 18.5 is considered underweight.  A BMI of 18.5 to 24.9 is normal.  A BMI of 25 to 29.9 is considered overweight.  A BMI of 30 and above is considered obese.  Watch levels of cholesterol and blood lipids  You should start having your blood tested for lipids and cholesterol at 43 years of age, then have this test every 5  years.  You may need to have your cholesterol levels checked more often if:  Your lipid or cholesterol levels are high.  You are older than 43 years of age.  You are at high risk for heart disease.  CANCER SCREENING   Lung Cancer  Lung cancer screening is recommended for adults 23-66 years old who are at high risk for lung cancer because of a history of smoking.  A yearly low-dose CT scan of the lungs is recommended for people who:  Currently smoke.  Have quit within the past 15 years.  Have at least a 30-pack-year history of smoking. A pack year is smoking an average of one pack of cigarettes a day for 1 year.  Yearly screening should continue until it has been 15 years since you quit.  Yearly screening should stop if you develop a health problem that would prevent you from having lung cancer treatment.  Breast Cancer  Practice breast self-awareness. This means understanding how your breasts normally appear and feel.  It also means doing regular breast self-exams. Let your health care provider know about any changes, no matter how small.  If you are in your 20s or 30s, you should have a clinical breast exam (CBE) by a health care provider every 1-3 years as part of a regular health exam.  If you are 71 or older, have a CBE every year. Also consider having a breast X-ray (mammogram) every year.  If you  have a family history of breast cancer, talk to your health care provider about genetic screening.  If you are at high risk for breast cancer, talk to your health care provider about having an MRI and a mammogram every year.  Breast cancer gene (BRCA) assessment is recommended for women who have family members with BRCA-related cancers. BRCA-related cancers include:  Breast.  Ovarian.  Tubal.  Peritoneal cancers.  Results of the assessment will determine the need for genetic counseling and BRCA1 and BRCA2 testing. Cervical Cancer Your health care provider may  recommend that you be screened regularly for cancer of the pelvic organs (ovaries, uterus, and vagina). This screening involves a pelvic examination, including checking for microscopic changes to the surface of your cervix (Pap test). You may be encouraged to have this screening done every 3 years, beginning at age 50.  For women ages 72-65, health care providers may recommend pelvic exams and Pap testing every 3 years, or they may recommend the Pap and pelvic exam, combined with testing for human papilloma virus (HPV), every 5 years. Some types of HPV increase your risk of cervical cancer. Testing for HPV may also be done on women of any age with unclear Pap test results.  Other health care providers may not recommend any screening for nonpregnant women who are considered low risk for pelvic cancer and who do not have symptoms. Ask your health care provider if a screening pelvic exam is right for you.  If you have had past treatment for cervical cancer or a condition that could lead to cancer, you need Pap tests and screening for cancer for at least 20 years after your treatment. If Pap tests have been discontinued, your risk factors (such as having a new sexual partner) need to be reassessed to determine if screening should resume. Some women have medical problems that increase the chance of getting cervical cancer. In these cases, your health care provider may recommend more frequent screening and Pap tests. Colorectal Cancer  This type of cancer can be detected and often prevented.  Routine colorectal cancer screening usually begins at 43 years of age and continues through 43 years of age.  Your health care provider may recommend screening at an earlier age if you have risk factors for colon cancer.  Your health care provider may also recommend using home test kits to check for hidden blood in the stool.  A small camera at the end of a tube can be used to examine your colon directly  (sigmoidoscopy or colonoscopy). This is done to check for the earliest forms of colorectal cancer.  Routine screening usually begins at age 43.  Direct examination of the colon should be repeated every 5-10 years through 43 years of age. However, you may need to be screened more often if early forms of precancerous polyps or small growths are found. Skin Cancer  Check your skin from head to toe regularly.  Tell your health care provider about any new moles or changes in moles, especially if there is a change in a mole's shape or color.  Also tell your health care provider if you have a mole that is larger than the size of a pencil eraser.  Always use sunscreen. Apply sunscreen liberally and repeatedly throughout the day.  Protect yourself by wearing long sleeves, pants, a wide-brimmed hat, and sunglasses whenever you are outside. HEART DISEASE, DIABETES, AND HIGH BLOOD PRESSURE   High blood pressure causes heart disease and increases the risk of stroke.  High blood pressure is more likely to develop in:  People who have blood pressure in the high end of the normal range (130-139/85-89 mm Hg).  People who are overweight or obese.  People who are African American.  If you are 42-104 years of age, have your blood pressure checked every 3-5 years. If you are 60 years of age or older, have your blood pressure checked every year. You should have your blood pressure measured twice--once when you are at a hospital or clinic, and once when you are not at a hospital or clinic. Record the average of the two measurements. To check your blood pressure when you are not at a hospital or clinic, you can use:  An automated blood pressure machine at a pharmacy.  A home blood pressure monitor.  If you are between 18 years and 59 years old, ask your health care provider if you should take aspirin to prevent strokes.  Have regular diabetes screenings. This involves taking a blood sample to check your  fasting blood sugar level.  If you are at a normal weight and have a low risk for diabetes, have this test once every three years after 43 years of age.  If you are overweight and have a high risk for diabetes, consider being tested at a younger age or more often. PREVENTING INFECTION  Hepatitis B  If you have a higher risk for hepatitis B, you should be screened for this virus. You are considered at high risk for hepatitis B if:  You were born in a country where hepatitis B is common. Ask your health care provider which countries are considered high risk.  Your parents were born in a high-risk country, and you have not been immunized against hepatitis B (hepatitis B vaccine).  You have HIV or AIDS.  You use needles to inject street drugs.  You live with someone who has hepatitis B.  You have had sex with someone who has hepatitis B.  You get hemodialysis treatment.  You take certain medicines for conditions, including cancer, organ transplantation, and autoimmune conditions. Hepatitis C  Blood testing is recommended for:  Everyone born from [birth year] through 1963.  Anyone with known risk factors for hepatitis C. Sexually transmitted infections (STIs)  You should be screened for sexually transmitted infections (STIs) including gonorrhea and chlamydia if:  You are sexually active and are younger than 43 years of age.  You are older than 43 years of age and your health care provider tells you that you are at risk for this type of infection.  Your sexual activity has changed since you were last screened and you are at an increased risk for chlamydia or gonorrhea. Ask your health care provider if you are at risk.  If you do not have HIV, but are at risk, it may be recommended that you take a prescription medicine daily to prevent HIV infection. This is called pre-exposure prophylaxis (PrEP). You are considered at risk if:  You are sexually active and do not regularly use condoms or  know the HIV status of your partner(s).  You take drugs by injection.  You are sexually active with a partner who has HIV. Talk with your health care provider about whether you are at high risk of being infected with HIV. If you choose to begin PrEP, you should first be tested for HIV. You should then be tested every 3 months for as long as you are taking PrEP.  PREGNANCY   If you  are premenopausal and you may become pregnant, ask your health care provider about preconception counseling.  If you may become pregnant, take 400 to 800 micrograms (mcg) of folic acid every day.  If you want to prevent pregnancy, talk to your health care provider about birth control (contraception). OSTEOPOROSIS AND MENOPAUSE   Osteoporosis is a disease in which the bones lose minerals and strength with aging. This can result in serious bone fractures. Your risk for osteoporosis can be identified using a bone density scan.  If you are 38 years of age or older, or if you are at risk for osteoporosis and fractures, ask your health care provider if you should be screened.  Ask your health care provider whether you should take a calcium or vitamin D supplement to lower your risk for osteoporosis.  Menopause may have certain physical symptoms and risks.  Hormone replacement therapy may reduce some of these symptoms and risks. Talk to your health care provider about whether hormone replacement therapy is right for you.  HOME CARE INSTRUCTIONS   Schedule regular health, dental, and eye exams.  Stay current with your immunizations.   Do not use any tobacco products including cigarettes, chewing tobacco, or electronic cigarettes.  If you are pregnant, do not drink alcohol.  If you are breastfeeding, limit how much and how often you drink alcohol.  Limit alcohol intake to no more than 1 drink per day for nonpregnant women. One drink equals 12 ounces of beer, 5 ounces of wine, or 1 ounces of hard liquor.  Do  not use street drugs.  Do not share needles.  Ask your health care provider for help if you need support or information about quitting drugs.  Tell your health care provider if you often feel depressed.  Tell your health care provider if you have ever been abused or do not feel safe at home.   This information is not intended to replace advice given to you by your health care provider. Make sure you discuss any questions you have with your health care provider.   Document Released: 02/26/2011 Document Revised: 09/03/2014 Document Reviewed: 07/15/2013 Elsevier Interactive Patient Education [employer].

## 2016-02-27 ENCOUNTER — Encounter: Payer: Self-pay | Admitting: Family Medicine

## 2016-02-27 DIAGNOSIS — Z0000 Encounter for general adult medical examination without abnormal findings: Secondary | ICD-10-CM | POA: Insufficient documentation

## 2016-02-27 NOTE — Assessment & Plan Note (Signed)
Pap smear up to date. Mammogram later this year.  Immunizations and STD testing up to date.

## 2016-02-27 NOTE — Progress Notes (Signed)
Subjective:  Patient ID: Monique Fletcher, female    DOB: 1971/04/19  Age: 45 y.o. MRN: [id number]  CC: Establish care  HPI Monique RobinsonsJoy T Ellen is a 45 y.o. female presents to the clinic today to establish care.  Preventative Healthcare  Pap smear: Up to date (12/2015); Followed by [facility]  Mammogram: In need of later this year. Was done 05/2015.  Immunizations  Tetanus - 2014.  Pneumococcal - Not indicated.   Labs: Declined screening labs today.  Exercise: Reports regular exercise.  Alcohol use: See below.  Smoking/tobacco use: Nonsmoker.  STD/HIV testing: Has had screening.   Regular dental exams: Yes.   Wears seat belt: Yes.   PMH, Surgical Hx, Family Hx, Social History reviewed and updated as below.  Past Medical History  Diagnosis Date  . GERD (gastroesophageal reflux disease)   . Elevated blood pressure     Had high blood pressure during pregnancy.  Marland Kitchen. Hx of migraines   . Multiple thyroid nodules    Past Surgical History  Procedure Laterality Date  . Breast surgery    . Misscarriage      2   Family History  Problem Relation Age of Onset  . Hypertension Father   . Stroke Maternal Grandfather   . Stroke Paternal Grandmother   . Diabetes Paternal Grandmother   . Diabetes Mother   . Sleep apnea Brother   . Thyroid disease Maternal Grandmother    Social History  Substance Use Topics  . Smoking status: Never Smoker   . Smokeless tobacco: Never Used  . Alcohol Use: 1.2 oz/week    2 Standard drinks or equivalent per week     Comment: OCCASIONALLY   Review of Systems  Gastrointestinal:       Recent diarrhea. Now resolved.   All other systems reviewed and are negative.  Objective:   Today's Vitals: BP 120/80 mmHg  Pulse 74  Ht 5\' 1"  (1.549 m)  Wt 153 lb (69.4 kg)  BMI 28.92 kg/m2  SpO2 95%  LMP 02/19/2016  Physical Exam  Constitutional: She is oriented to person, place, and time. She appears well-developed and well-nourished. No  distress.  HENT:  Head: Normocephalic and atraumatic.  Nose: Nose normal.  Mouth/Throat: Oropharynx is clear and moist. No oropharyngeal exudate.  Normal TM's bilaterally.   Eyes: Conjunctivae are normal. No scleral icterus.  Neck: Neck supple. No thyromegaly present.  Cardiovascular: Normal rate and regular rhythm.   No murmur heard. Pulmonary/Chest: Effort normal and breath sounds normal. She has no wheezes. She has no rales.  Abdominal: Soft. She exhibits no distension. There is no tenderness. There is no rebound and no guarding.  Musculoskeletal: Normal range of motion. She exhibits no edema.  Lymphadenopathy:    She has no cervical adenopathy.  Neurological: She is alert and oriented to person, place, and time.  Skin: Skin is warm and dry. No rash noted.  Psychiatric: She has a normal mood and affect.  Vitals reviewed.  Assessment & Plan:   Problem List Items Addressed This Visit    Well woman exam without gynecological exam - Primary    Pap smear up to date. Mammogram later this year.  Immunizations and STD testing up to date.          Outpatient Encounter Prescriptions as of 02/24/2016  Medication Sig  . Multiple Vitamin (MULTIVITAMIN WITH MINERALS) TABS tablet Take 1 tablet by mouth daily.  . vitamin C (ASCORBIC ACID) 500 MG tablet Take 500 mg by  mouth daily.   No facility-administered encounter medications on file as of 02/24/2016.    Follow-up: Annually.  Everlene OtherJayce Mena Simonis DO [facility]

## 2016-05-02 IMAGING — US US SOFT TISSUE HEAD/NECK
1 series · 13 of 25 positions shown · non-contrast
Comparison: None.

CLINICAL DATA: Evaluate for thyroid nodules.

EXAM:
THYROID ULTRASOUND
TECHNIQUE: Ultrasound examination of the thyroid gland and adjacent soft
tissues was performed.

[Series 1: us soft tissue head/neck · 0.08mm/px · 13 of 73 slices shown]
[im 1/73]
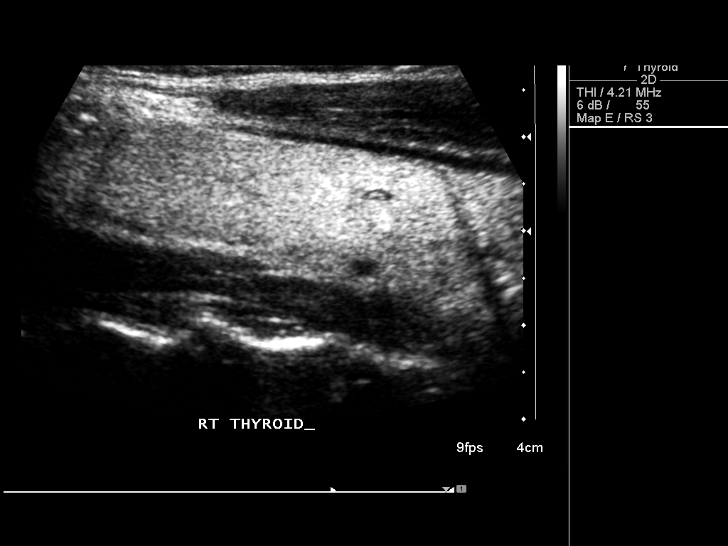
[im 7/73]
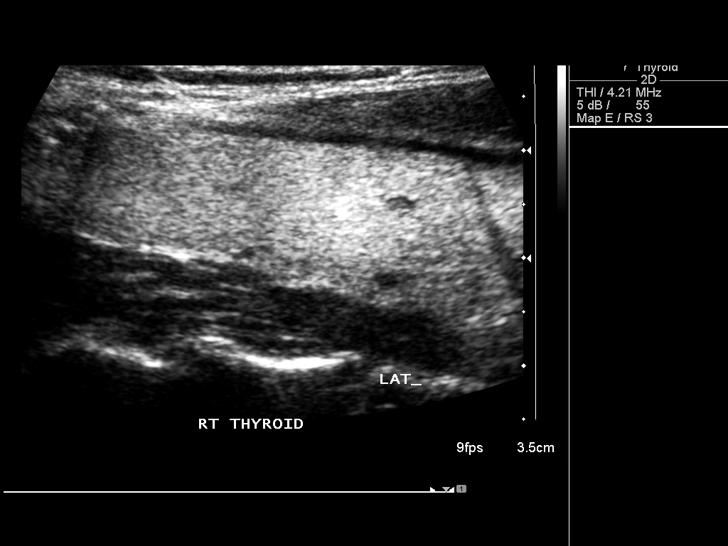
[im 13/73]
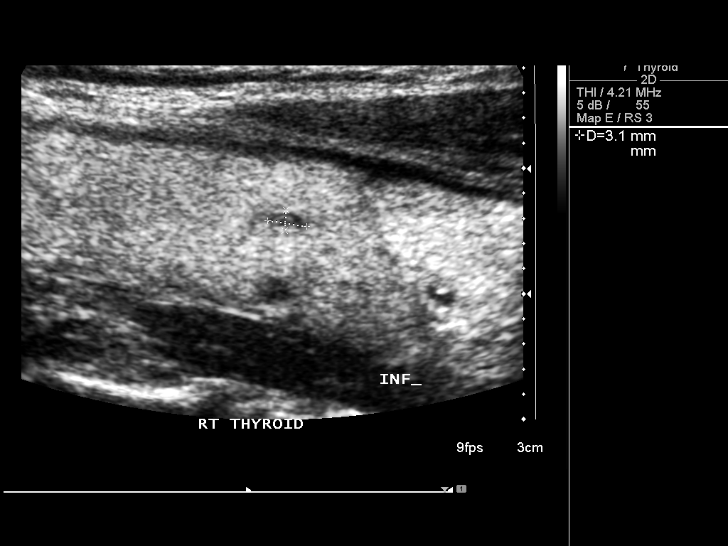
[im 19/73]
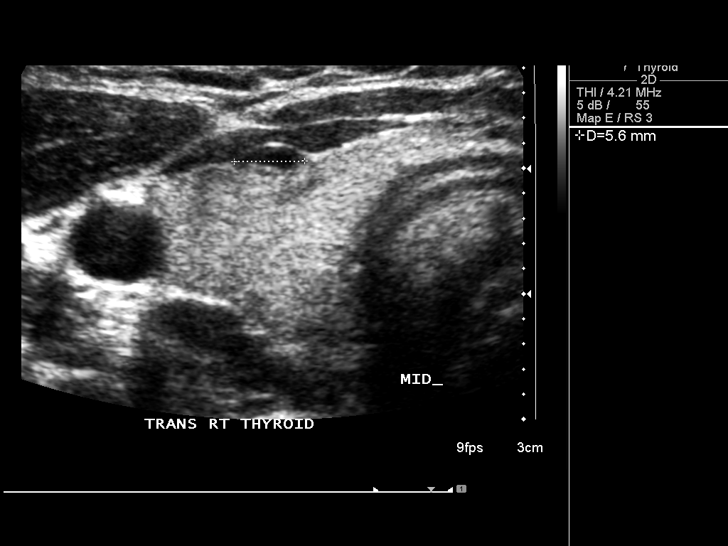
[im 25/73]
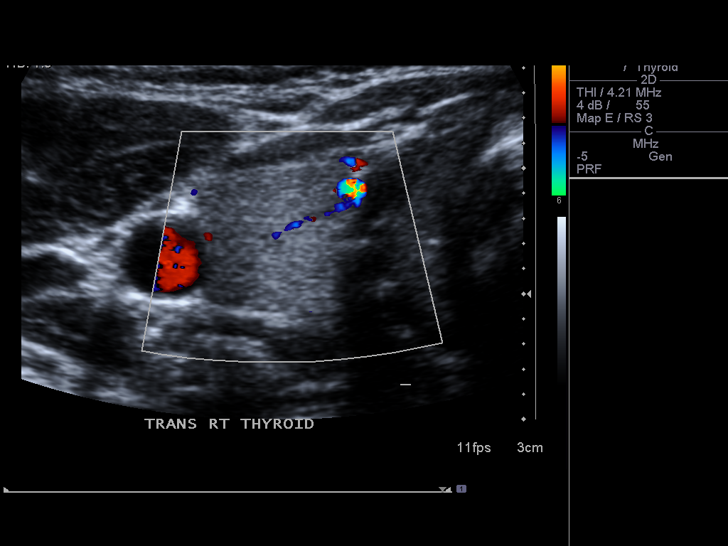
[im 31/73]
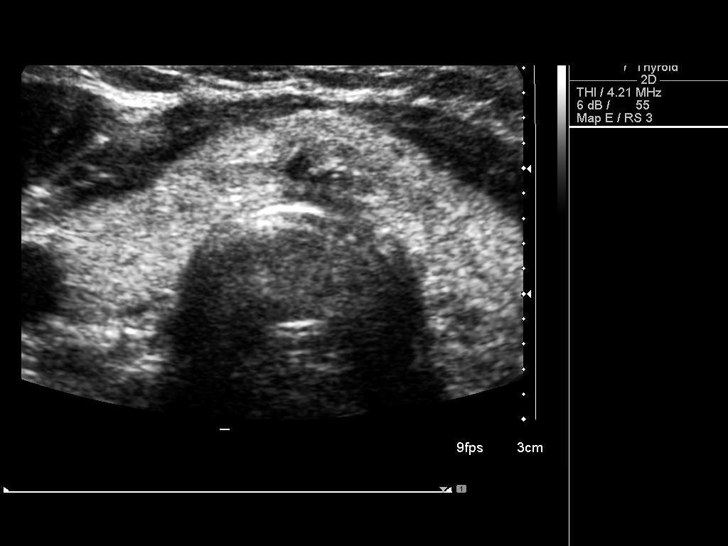
[im 37/73]
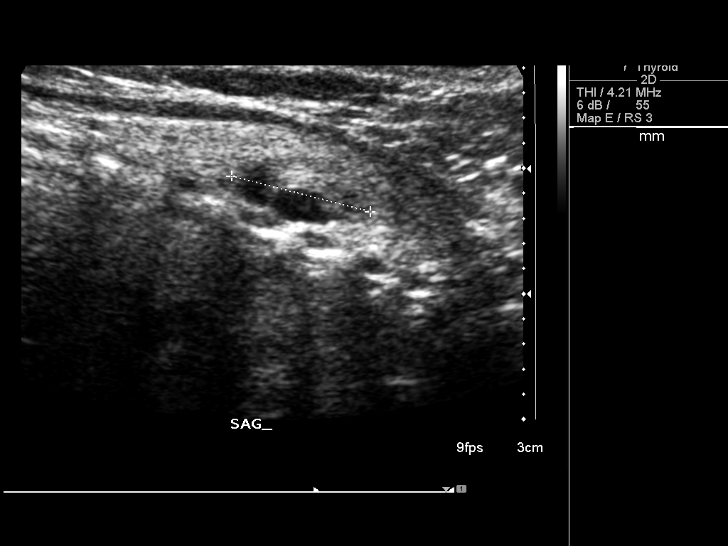
[im 43/73]
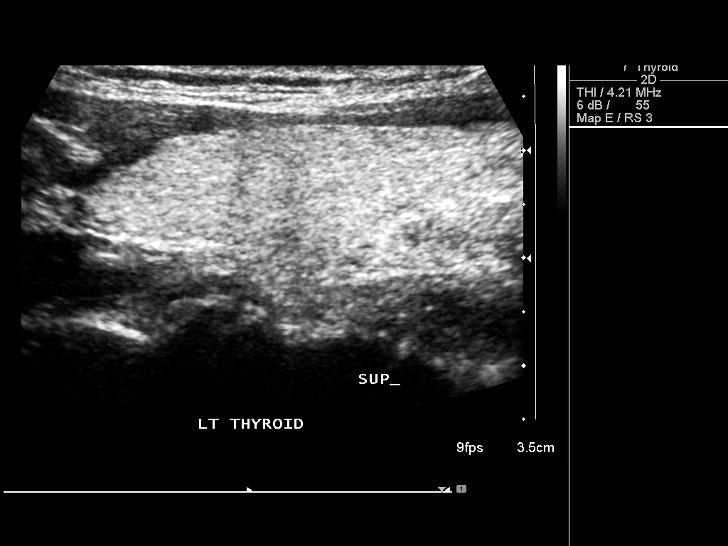
[im 49/73]
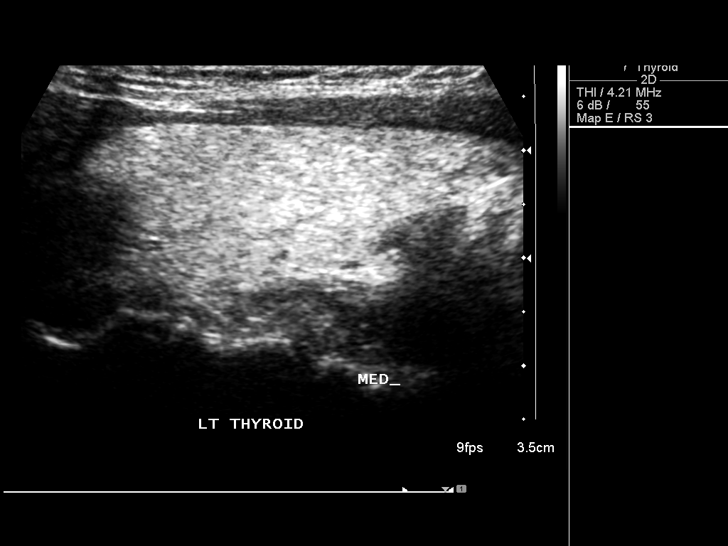
[im 55/73]
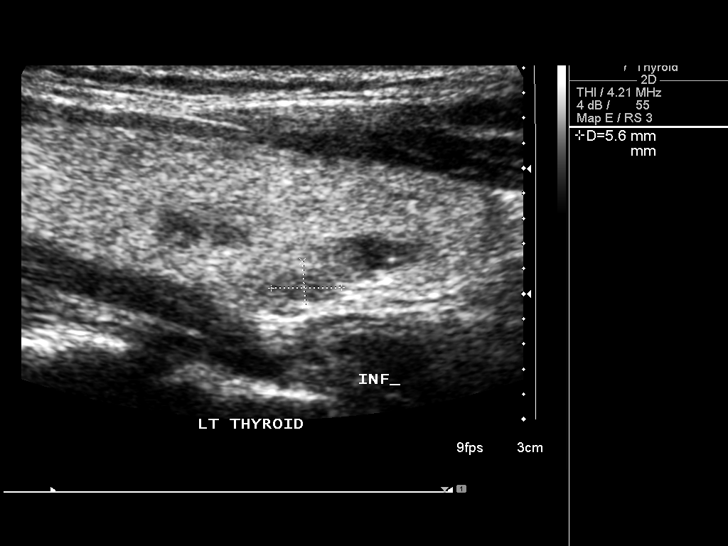
[im 61/73]
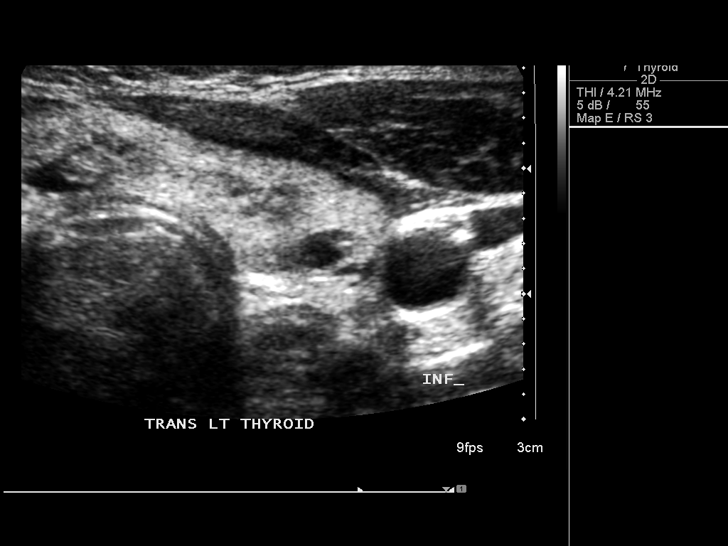
[im 67/73]
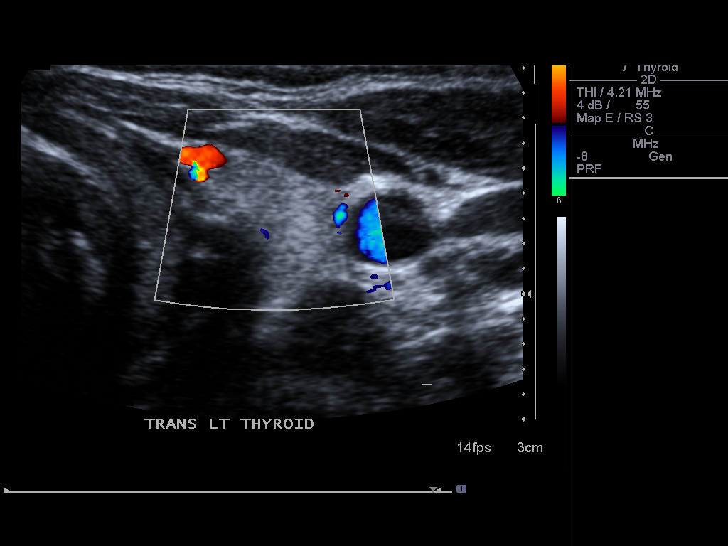
[im 73/73]
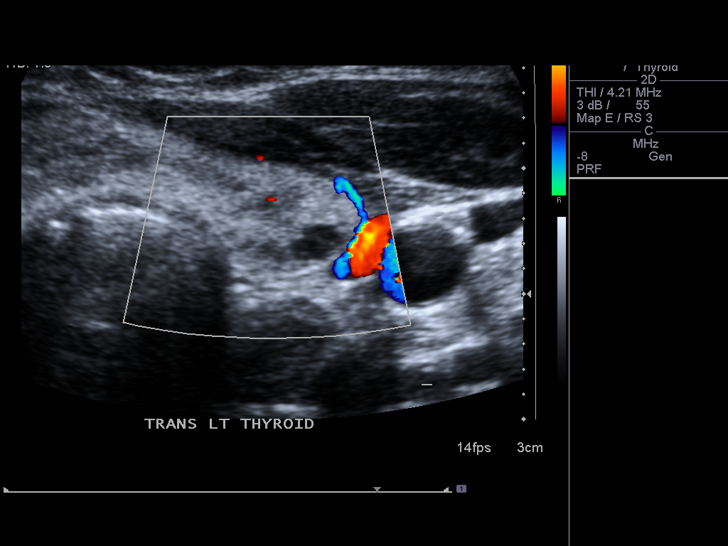

[13 of 25 positions shown; findings below may reference images not displayed]

FINDINGS: Right thyroid lobe

Measurements: 4.7 x 1.4 x 1.7 cm. There are multiple small nodules
scattered throughout the right thyroid lobe. Largest right thyroid
nodule measures 0.6 cm.

Left thyroid lobe

Measurements: 5.1 x 1.5 x 1.7 cm. Multiple small nodules scattered
throughout the left thyroid lobe. Largest left thyroid nodule
measures up to 0.9 cm.

Isthmus

Thickness: 0.5 cm. There is an oval-shaped heterogeneous nodule in
the isthmus that measures 1.2 x 0.5 x 1.1 cm. This nodule is mildly
complex.

Lymphadenopathy

None visualized.
IMPRESSION: Bilateral thyroid nodules. Largest nodule measures 1.2 cm in the
isthmus. Findings do not meet current SRU consensus criteria for
biopsy. Follow-up by clinical exam is recommended. If patient has
known risk factors for thyroid carcinoma, consider follow-up
ultrasound in 12 months. If patient is clinically hyperthyroid,
consider nuclear medicine thyroid uptake and scan.Reference:
Management of Thyroid Nodules Detected at US: Society of
Radiologists in Ultrasound Consensus Conference Statement. Radiology

## 2016-05-03 ENCOUNTER — Ambulatory Visit: Payer: 59 | Admitting: Gastroenterology

## 2016-05-16 ENCOUNTER — Other Ambulatory Visit: Payer: 59

## 2016-05-22 ENCOUNTER — Encounter: Payer: 59 | Admitting: Family Medicine

## 2016-09-14 IMAGING — US US SOFT TISSUE HEAD/NECK
1 series · 13 of 25 positions shown · non-contrast
Comparison: 04/12/2014

CLINICAL DATA: Follow-up thyroid nodule

EXAM:
THYROID ULTRASOUND
TECHNIQUE: Ultrasound examination of the thyroid gland and adjacent soft
tissues was performed.

[Series 1: us soft tissue head/neck · 0.08mm/px · 13 of 70 slices shown]
[im 1/70]
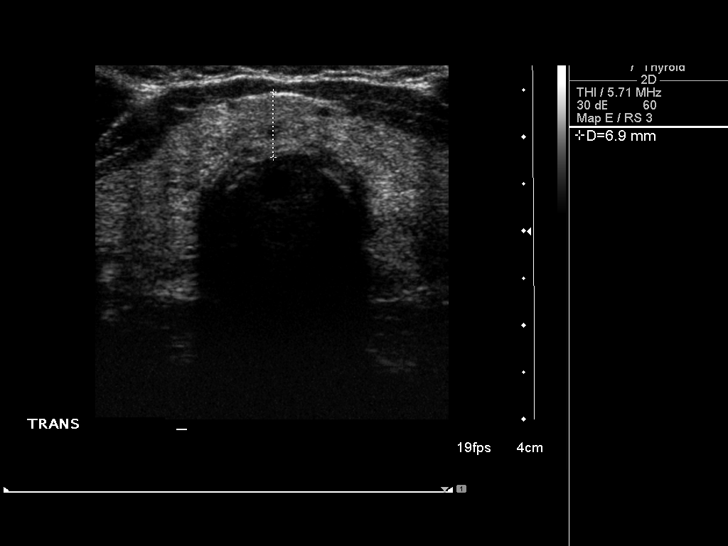
[im 6/70]
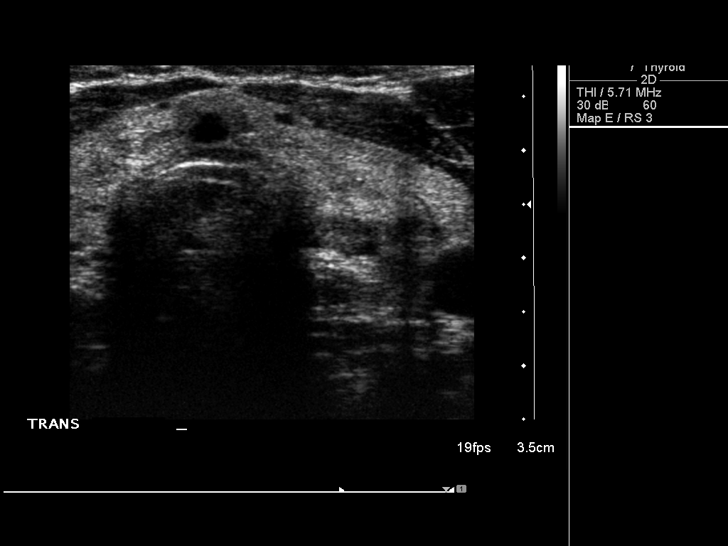
[im 12/70]
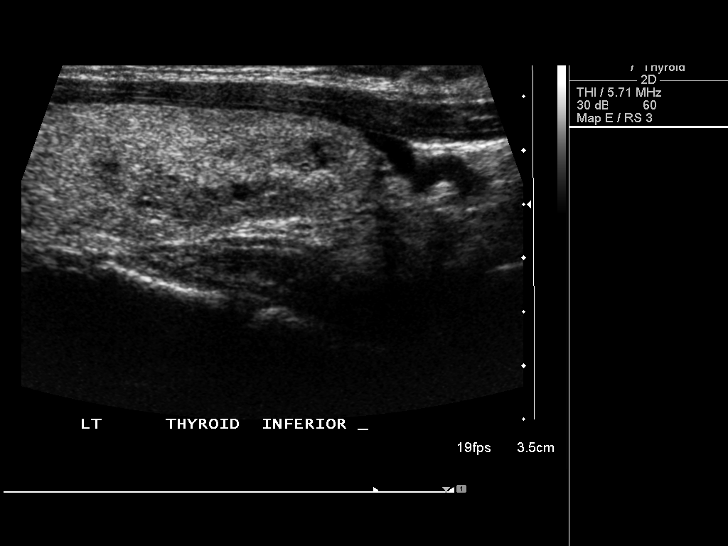
[im 18/70]
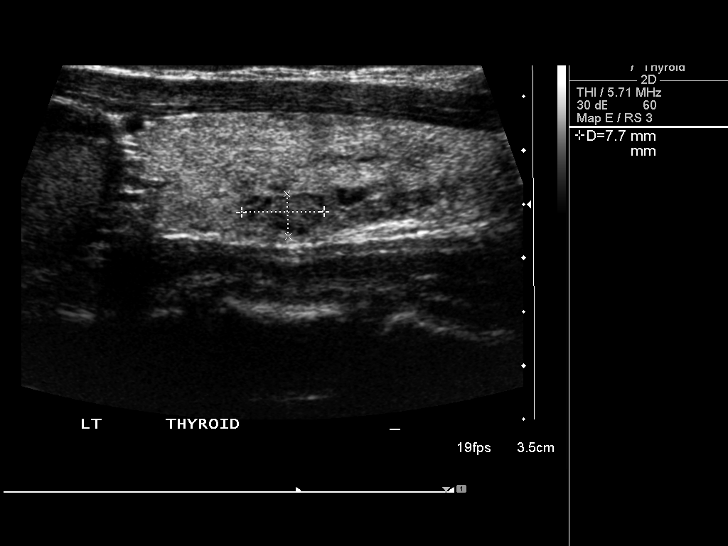
[im 24/70]
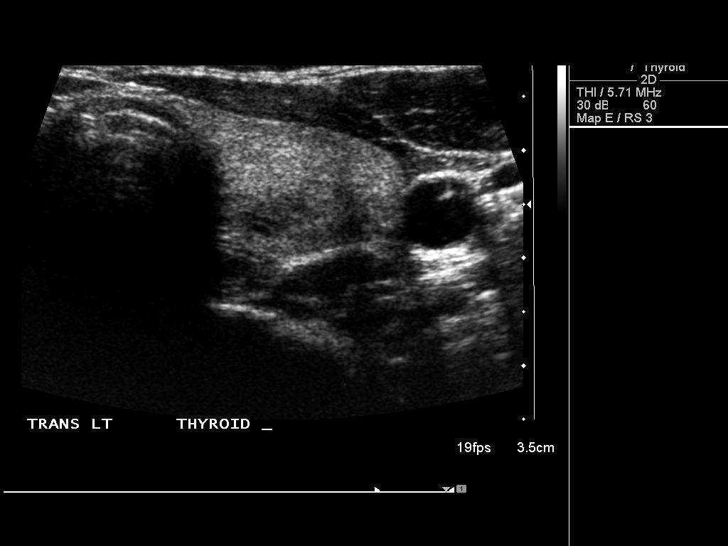
[im 29/70]
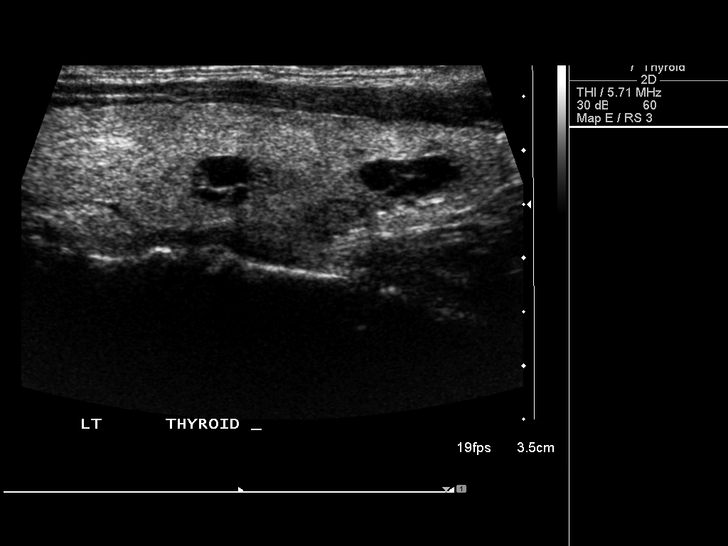
[im 35/70]
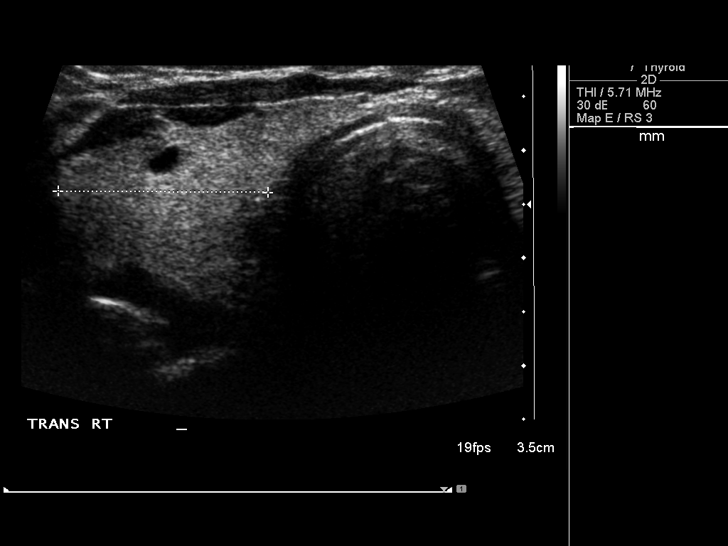
[im 41/70]
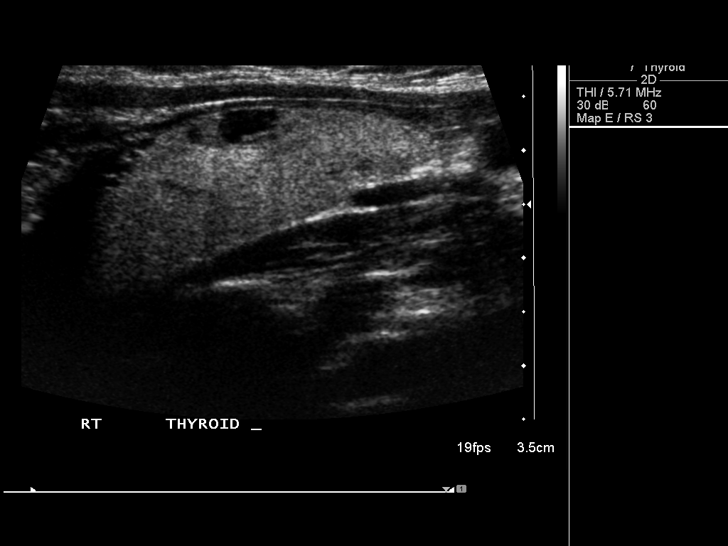
[im 47/70]
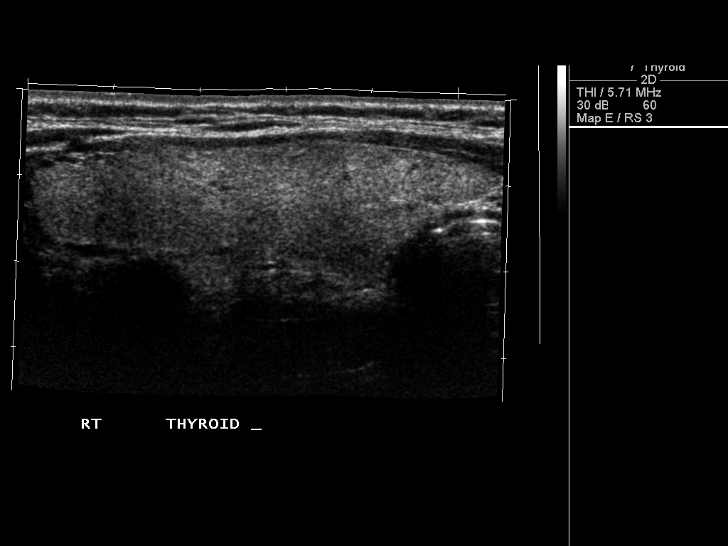
[im 52/70]
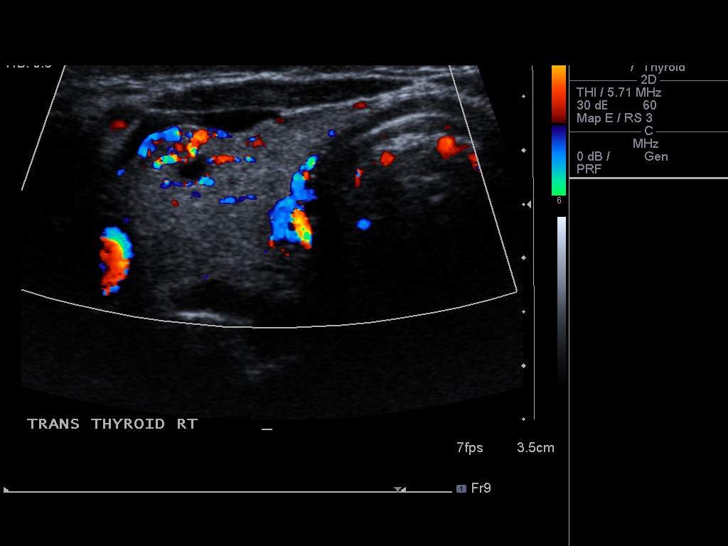
[im 58/70]
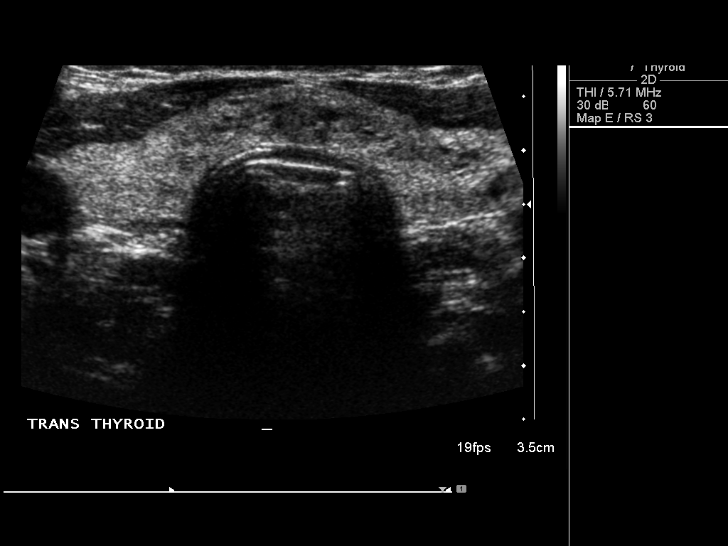
[im 64/70]
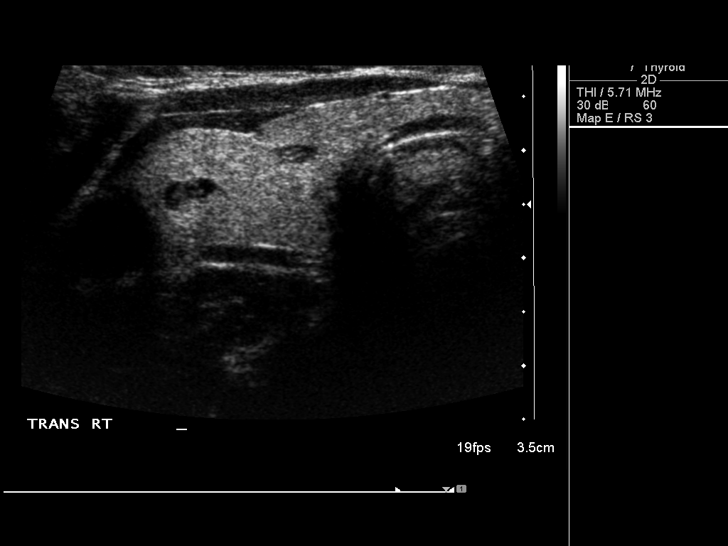
[im 70/70]
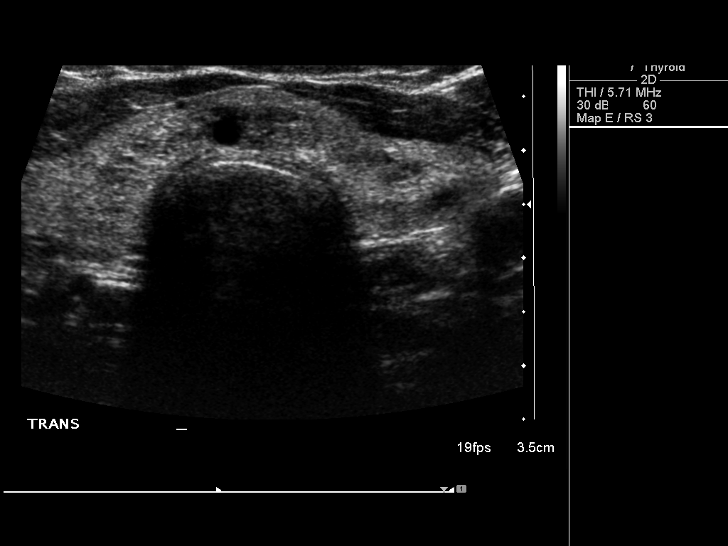

[13 of 25 positions shown; findings below may reference images not displayed]

FINDINGS: Right thyroid lobe

Measurements: 5.5 x 1.7 x 2.0 cm. Right upper pole complex nodule
measures 10 x 4 x 8 mm mm. Other tiny nodules are present.

Left thyroid lobe

Measurements: 4.6 x 1.4 x 1.7 cm. Multiple nodules are scattered
throughout the left lobe. The largest is a complex nodule in the mid
lobe measuring 10 x 5 x 7 mm. There is a lower pole complex nodule
measuring 10 x 5 x 6 mm.

Isthmus

Thickness: 7 mm.  Complex nodule measures 14 x 4 x 7 mm.

Lymphadenopathy

None visualized.
IMPRESSION: Multiple bilateral complex nodules are again noted and are not
significantly changed. They all remain below measurement criteria
for fine needle aspiration biopsy. Findings do not meet current SRU
consensus criteria for biopsy. Follow-up by clinical exam is
recommended. If patient has known risk factors for thyroid
carcinoma, consider follow-up ultrasound in 12 months. If patient is
clinically hyperthyroid, consider nuclear medicine thyroid uptake
and scan.Reference: Management of Thyroid Nodules Detected at US:
Society of Radiologists in Ultrasound Consensus Conference

## 2017-03-29 ENCOUNTER — Encounter: Payer: 59 | Admitting: Family Medicine

## 2017-09-25 DIAGNOSIS — E041 Nontoxic single thyroid nodule: Secondary | ICD-10-CM | POA: Diagnosis not present

## 2017-09-25 DIAGNOSIS — Z01419 Encounter for gynecological examination (general) (routine) without abnormal findings: Secondary | ICD-10-CM | POA: Diagnosis not present

## 2017-09-25 DIAGNOSIS — Z0000 Encounter for general adult medical examination without abnormal findings: Secondary | ICD-10-CM | POA: Diagnosis not present

## 2018-01-31 DIAGNOSIS — L700 Acne vulgaris: Secondary | ICD-10-CM | POA: Diagnosis not present

## 2018-01-31 DIAGNOSIS — L209 Atopic dermatitis, unspecified: Secondary | ICD-10-CM | POA: Diagnosis not present

## 2018-02-20 DIAGNOSIS — H1132 Conjunctival hemorrhage, left eye: Secondary | ICD-10-CM | POA: Diagnosis not present

## 2018-04-11 DIAGNOSIS — Z1231 Encounter for screening mammogram for malignant neoplasm of breast: Secondary | ICD-10-CM | POA: Diagnosis not present

## 2018-07-21 ENCOUNTER — Ambulatory Visit (INDEPENDENT_AMBULATORY_CARE_PROVIDER_SITE_OTHER): Payer: 59 | Admitting: Internal Medicine

## 2018-07-21 ENCOUNTER — Encounter: Payer: Self-pay | Admitting: Internal Medicine

## 2018-07-21 VITALS — BP 110/60 | HR 61 | Ht 60.83 in | Wt 151.0 lb

## 2018-07-21 DIAGNOSIS — E042 Nontoxic multinodular goiter: Secondary | ICD-10-CM

## 2018-07-21 NOTE — Patient Instructions (Signed)
We will schedule a new thyroid U/S.  Return to see me as needed afterwards.

## 2018-07-21 NOTE — Progress Notes (Addendum)
Patient ID: Monique Fletcher, female   DOB: 12/04/1970, 47 y.o.   MRN: [id number]   HPI  Monique Fletcher is a 47 y.o.-year-old female, returning for follow-up for multinodular goiter. He has been previously seen by Dr.Phadke, who since left the practice.  Last visit with me more than 3 years ago.  Patient was diagnosed with a nontoxic multinodular goiter in 08/2012 by her OB/GYN provider (Dr. Billy Coast).  She was referred to her PCP, who ordered a thyroid ultrasound.  Reviewed patient's test reports and images as below:  Thyroid ultrasound (04/12/2014): bilateral thyroid nodules - Largest nodule measuring 1.2 x 0.5 x 1.1 cm in the isthmus - mixed nodule without any suspicious features.  - There are multiple smaller nodules scattered throughout the left lobe with the largest left-sided Thyroid nodule measuring 0.9 cm.  - There are multiple smaller nodules in the right thyroid lobe with the largest Thyroid nodule measuring 0.6 cm.  - No lymphadenopathy.    Thyroid ultrasound (06/24/2015): Right thyroid lobe: 5.5 x 1.7 x 2.0 cm.  - Right upper pole complex nodule measures 10 x 4 x 8 mm mm.  - Other tiny nodules are present. Left thyroid lobe: 4.6 x 1.4 x 1.7 cm. Multiple nodules are scattered throughout the left lobe.  - The largest is a complex nodule in the mid lobe measuring 10 x 5 x 7 mm.  - There is a lower pole complex nodule measuring 10 x 5 x 6 mm. Isthmus Thickness: 7 mm. Complex nodule measures 14 x 4 x 7 mm. Lymphadenopathy: None visualized.  IMPRESSION: Multiple bilateral complex nodules are again noted and are not significantly changed.  They all remain below measurement criteria for fine needle aspiration biopsy.  Pt denies: - feeling nodules in neck - hoarseness - dysphagia - choking - SOB with lying down  Reviewed patient's TFTs: 09/25/2017: TSH 0.75 Lab Results  Component Value Date   TSH 0.63 10/21/2015   TSH 0.882 09/24/2014   TSH 1.30 04/07/2014   TSH 0.79  12/15/2008   TSH 0.96 10/01/2006   11/13/2012: TPO Abs 0.00   She has a family history of thyroid disorders in her maternal grandmother. No FH of thyroid cancer. No h/o radiation tx to head or neck.  No herbal supplements. No Biotin use. No recent steroids use.   ROS: Constitutional: no weight gain/no weight loss, no fatigue, no subjective hyperthermia, no subjective hypothermia Eyes: no blurry vision, no xerophthalmia ENT: no sore throat, no nodules palpated in neck, no dysphagia, no odynophagia, no hoarseness Cardiovascular: no CP/no SOB/no palpitations/no leg swelling Respiratory: no cough/no SOB/no wheezing Gastrointestinal: no N/no V/no D/no C/no acid reflux Musculoskeletal: no muscle aches/no joint aches Skin: no rashes, no hair loss Neurological: no tremors/no numbness/no tingling/no dizziness  I reviewed pt's medications, allergies, PMH, social hx, family hx, and changes were documented in the history of present illness. Otherwise, unchanged from my initial visit note.  Past Medical History:  Diagnosis Date  . Elevated blood pressure    Had high blood pressure during pregnancy.  Marland Kitchen GERD (gastroesophageal reflux disease)   . Hx of migraines   . Multiple thyroid nodules    Past Surgical History:  Procedure Laterality Date  . BREAST SURGERY    . MISSCARRIAGE     2   Social History   Social History  . Marital Status: Single    Spouse Name: N/A   Social History Main Topics  . Smoking status: Never Smoker   .  Smokeless tobacco: Never Used  . Alcohol Use: 1.0 oz/week    2 drink(s) per week  . Drug Use: No  . Sexual Activity: No   Other Topics Concern  . Not on file   Social History Narrative   1 daughter   Single   HR for career   Exercise: minimal   Diet: good, lots of veggies, water   Current Outpatient Medications on File Prior to Visit  Medication Sig Dispense Refill  . Multiple Vitamin (MULTIVITAMIN WITH MINERALS) TABS tablet Take 1 tablet by mouth  daily.    . vitamin C (ASCORBIC ACID) 500 MG tablet Take 500 mg by mouth daily.     No current facility-administered medications on file prior to visit.    Allergies  Allergen Reactions  . Cephalexin     REACTION: severe diarrhea  . Macrobid [Nitrofurantoin Monohyd Macro] Nausea And Vomiting  . Metronidazole Nausea And Vomiting    REACTION: nausea and vomiting  . Sulfonamide Derivatives Hives    REACTION: facial swelling   Family History  Problem Relation Age of Onset  . Hypertension Father   . Stroke Maternal Grandfather   . Stroke Paternal Grandmother   . Diabetes Paternal Grandmother   . Diabetes Mother   . Sleep apnea Brother   . Thyroid disease Maternal Grandmother    PE: BP 110/60   Pulse 61   Ht 5' 0.83" (1.545 m) Comment: measured  Wt 151 lb (68.5 kg)   LMP 08/27/2017   SpO2 99%   BMI 28.69 kg/m  Wt Readings from Last 3 Encounters:  07/21/18 151 lb (68.5 kg)  02/24/16 153 lb (69.4 kg)  06/17/15 152 lb (68.9 kg)   Constitutional: Slightly overweight, in NAD Eyes: PERRLA, EOMI, no exophthalmos ENT: moist mucous membranes, no thyromegaly, + palpable isthmus nodule, no cervical lymphadenopathy Cardiovascular: RRR, No MRG Respiratory: CTA B Gastrointestinal: abdomen soft, NT, ND, BS+ Musculoskeletal: no deformities, strength intact in all 4 Skin: moist, warm, no rashes Neurological: no tremor with outstretched hands, DTR normal in all 4  ASSESSMENT: 1. MNG   PLAN: 1. MNG  -I reviewed the report and the images of patient's thyroid ultrasound from 03/2014 along with the report of her thyroid ultrasound from 05/2015: She has small, 1 cm nodules in the left and right lobe and a 1.4 cm nodule in the isthmus.  This is palpable on exam but may be not so much as at last visit.. -The nodules are: -Isoechoic- partly colloid-filled -Without microcalcifications -Without internal blood flow -More twice than tall -Fairly well delimited from surrounding tissues. She  does not have a family history of thyroid cancer or personal history of radiation therapy to head or neck.  All these would favor benignity. -On the last ultrasound, there was no indication for thyroid biopsy based on the appearance and size of the nodule.  I advised her to return in 2 years, but she now returns after 3 years from previous visit. -At this visit, she does not have any neck compression symptoms -Reviewed most recent TSH and this was normal per labs from PCP obtained on 09/25/2017 -We decided to recheck her thyroid ultrasound and if the nodules appear stable, we do not need to investigate them further.  This is a good sign that they are benign. -She will then return to see me only if she develops neck compression symptoms  Orders Placed This Encounter  Procedures  . US Soft Tissue Head/Neck   US THYROID CLINICAL DATA:  47 year old female with thyroid goiter  EXAM: THYROID ULTRASOUND  TECHNIQUE: Ultrasound examination of the thyroid gland and adjacent soft tissues was performed.  COMPARISON:  06/24/2015, 04/12/2014  FINDINGS: Parenchymal Echotexture: Moderately heterogenous  Isthmus: 0.7 cm  Right lobe: 5.3 cm x 1.3 cm x 2.0 cm  Left lobe: 5.4 cm x 1.6 cm x 1.7 cm  _________________________________________________________  Estimated total number of nodules >/= 1 cm: 4  Number of spongiform nodules >/=  2 cm not described below (TR1): 0  Number of mixed cystic and solid nodules >/= 1.5 cm not described below (TR2): 0  _________________________________________________________  Nodule # 1:  Location: Isthmus; Mid  Maximum size: 1.7 cm; Other 2 dimensions: 1.6 cm x 0.5 cm  Composition: solid/almost completely solid (2)  Echogenicity: isoechoic (1)  Shape: not taller-than-wide (0)  Margins: ill-defined (0)  Echogenic foci: none (0)  ACR TI-RADS total points: 3.  ACR TI-RADS risk category: TR4 (4-6 points).  ACR TI-RADS recommendations:  Nodule  meets criteria for surveillance  _________________________________________________________  Nodule # 2:  Location: Isthmus; inferior  Maximum size: 0.8 cm; Other 2 dimensions: 0.5 cm x 0.4 cm  Composition: cystic/almost completely cystic (0)  Echogenicity: anechoic (0)  Shape: not taller-than-wide (0)  Margins: smooth (0)  Echogenic foci: none (0)  ACR TI-RADS total points: 0.  ACR TI-RADS risk category: TR1 (0-1 points).  ACR TI-RADS recommendations:  Cystic nodule does not meet criteria for surveillance or biopsy  _________________________________________________________  Nodule # 3:  Location: Right; Superior  Maximum size: 1.0 cm; Other 2 dimensions: 1.1 cm x 0.6 cm  Composition: mixed cystic and solid (1)  Echogenicity: isoechoic (1)  Shape: not taller-than-wide (0)  Margins: smooth (0)  Echogenic foci: none (0)  ACR TI-RADS total points: 2.  ACR TI-RADS risk category: TR2 (2 points).  ACR TI-RADS recommendations:  Cystic nodule does not meet criteria for surveillance or biopsy  _________________________________________________________  Nodule # 4:  Location: Left; Mid  Maximum size: 1.8 cm; Other 2 dimensions: 1.1 cm x 0.9 cm  Composition: spongiform (0)  ACR TI-RADS recommendations:  Spongiform nodule does not meet criteria for surveillance or biopsy  _________________________________________________________  Nodule # 5:  Location: Left; Inferior  Maximum size: 1.4 cm; Other 2 dimensions: 1.1 cm x 0.7 cm  Composition: mixed cystic and solid (1)  Echogenicity: isoechoic (1)  Shape: not taller-than-wide (0)  Margins: smooth (0)  Echogenic foci: none (0)  ACR TI-RADS total points: 2.  ACR TI-RADS risk category: TR2 (2 points).  ACR TI-RADS recommendations:  Cystic nodule does not meet criteria for surveillance or biopsy  _________________________________________________________  No adenopathy  IMPRESSION: Isthmic  thyroid nodule (labeled 1) meets criteria for surveillance, as designated by the newly established ACR TI-RADS criteria. Surveillance ultrasound study recommended to be performed annually up to 5 years. Nodule has been unchanged since the study dated April 12, 2014. It would be reasonable for 1 further year of surveillance.  No other thyroid nodule meets criteria for biopsy or surveillance, as designated by the newly established ACR TI-RADS criteria.  Recommendations follow those established by the new ACR TI-RADS criteria (J Am Coll Radiol 2017;14:587-595).  Electronically Signed   By: Gilmer MorJaime  Wagner D.O.   On: 08/01/2018 17:00   Carlus Pavlovristina Shauntay Brunelli, MD PhD [facility]

## 2018-07-29 DIAGNOSIS — J358 Other chronic diseases of tonsils and adenoids: Secondary | ICD-10-CM | POA: Diagnosis not present

## 2018-07-29 DIAGNOSIS — K116 Mucocele of salivary gland: Secondary | ICD-10-CM | POA: Diagnosis not present

## 2018-08-01 ENCOUNTER — Ambulatory Visit
Admission: RE | Admit: 2018-08-01 | Discharge: 2018-08-01 | Disposition: A | Discharge status: Home / Self Care | Payer: 59 | Source: Ambulatory Visit | Attending: Internal Medicine | Admitting: Internal Medicine

## 2018-08-01 ENCOUNTER — Ambulatory Visit: Payer: 59 | Admitting: Internal Medicine

## 2018-08-01 DIAGNOSIS — E041 Nontoxic single thyroid nodule: Secondary | ICD-10-CM | POA: Diagnosis not present

## 2018-08-01 DIAGNOSIS — E042 Nontoxic multinodular goiter: Secondary | ICD-10-CM

## 2020-06-25 IMAGING — US US THYROID
1 series · 12 of 25 positions shown · non-contrast
Comparison: 06/24/2015, 04/12/2014

CLINICAL DATA: 47-year-old female with thyroid goiter

EXAM:
THYROID ULTRASOUND
TECHNIQUE: Ultrasound examination of the thyroid gland and adjacent soft
tissues was performed.

[Series 1: us thyroid · 0.04mm/px · 12 of 63 slices shown]
[im 3/63]
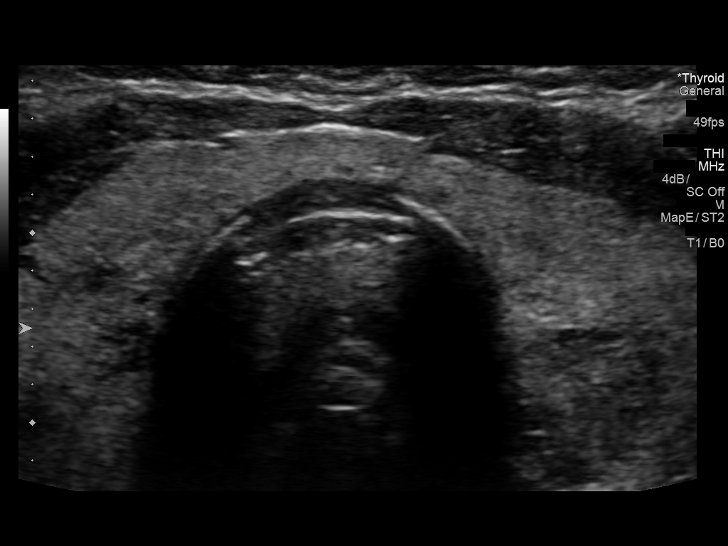
[im 8/63]
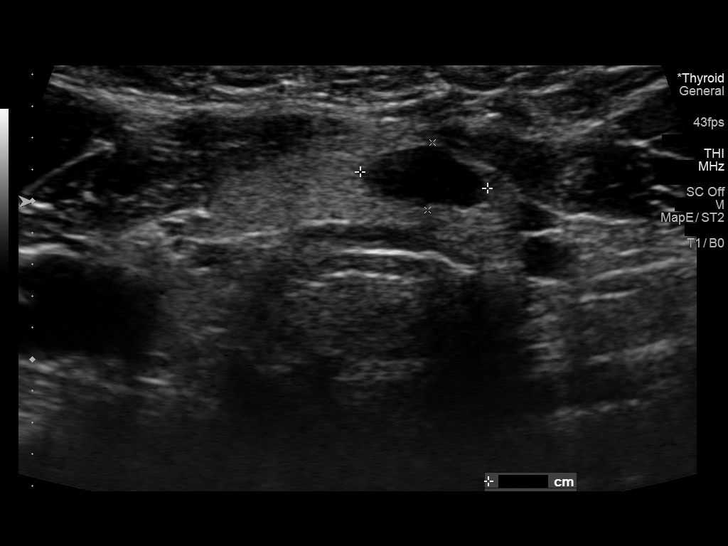
[im 13/63]
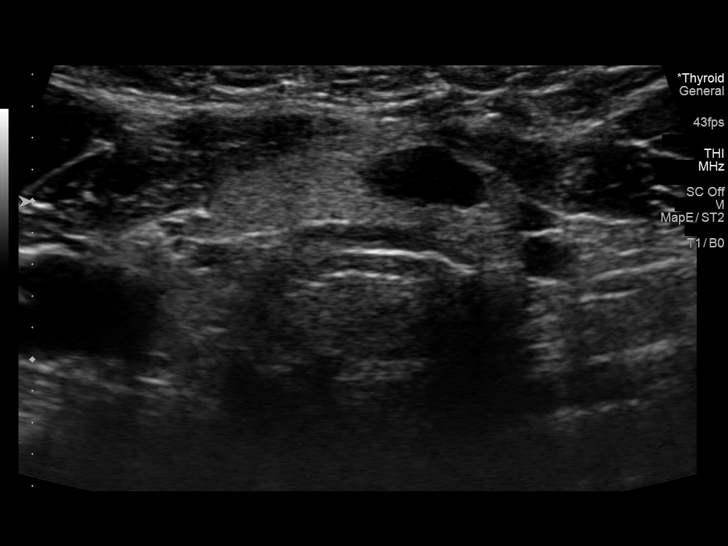
[im 19/63]
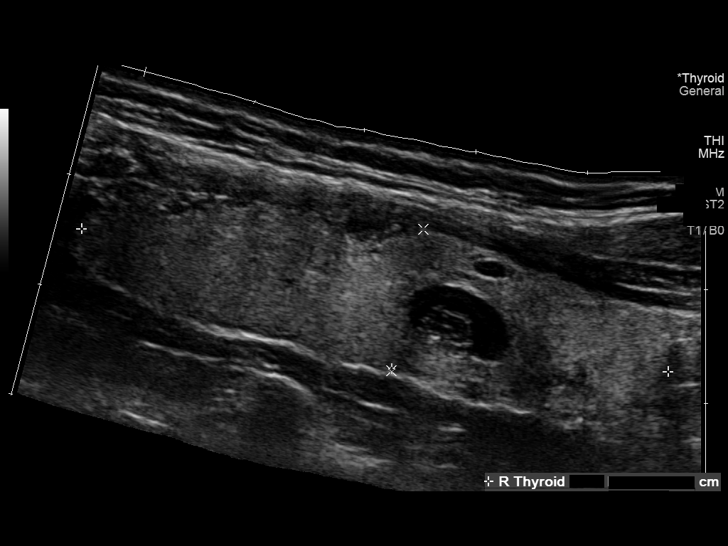
[im 24/63]
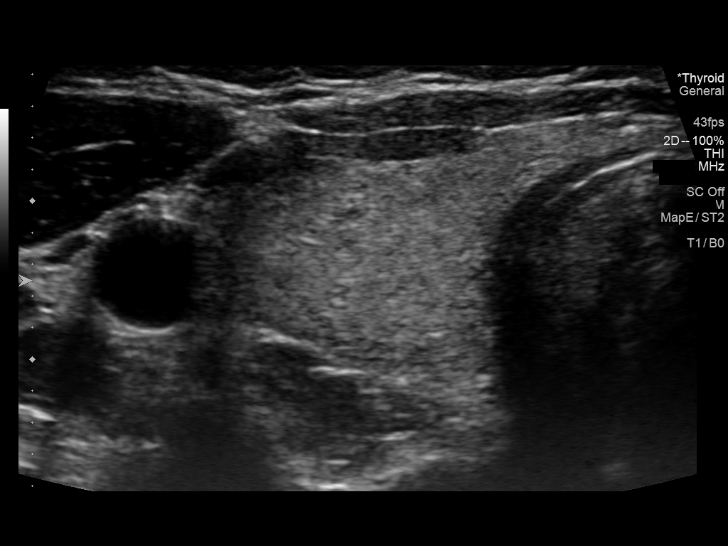
[im 29/63]
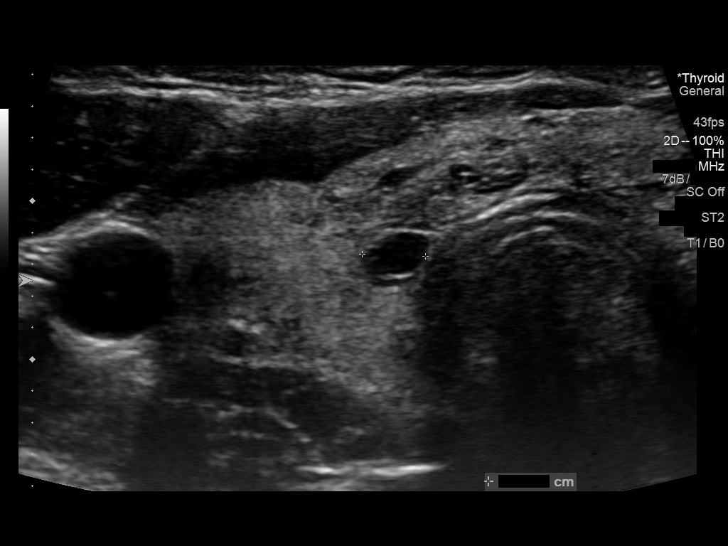
[im 34/63]
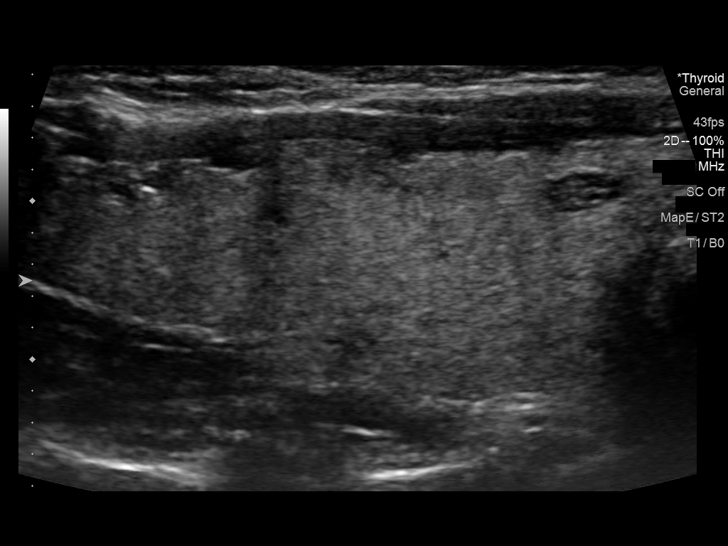
[im 39/63]
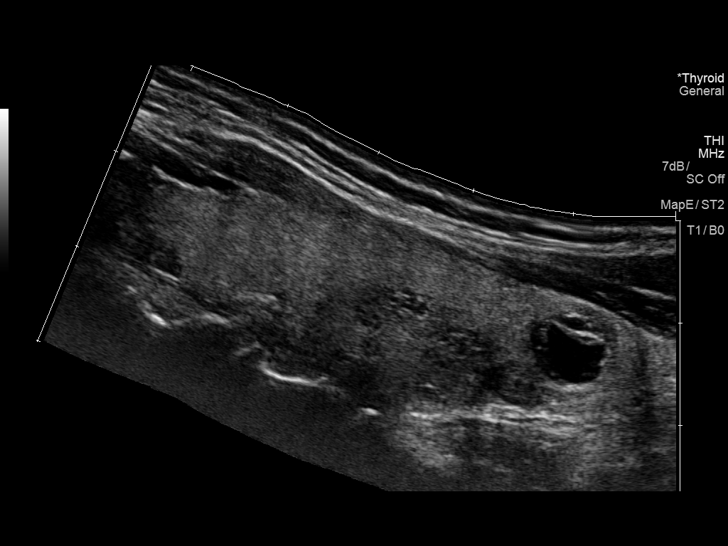
[im 44/63]
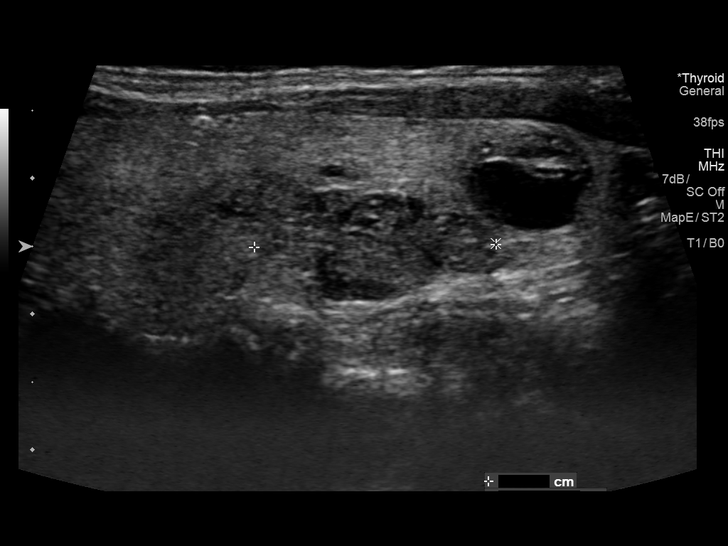
[im 50/63]
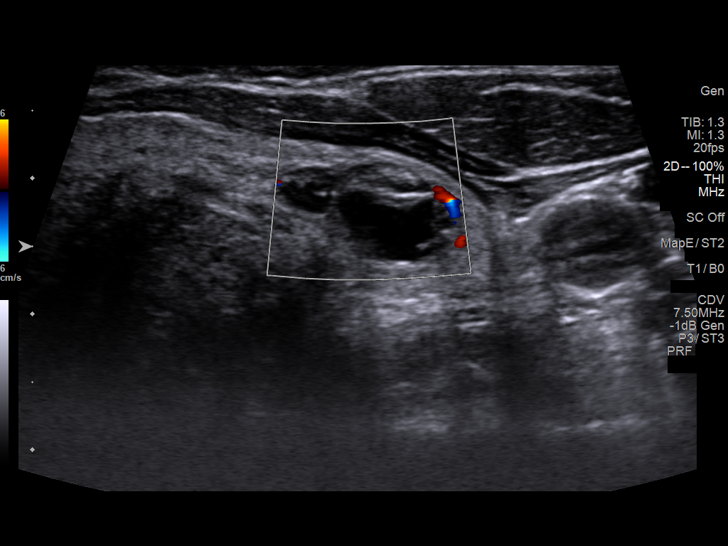
[im 55/63]
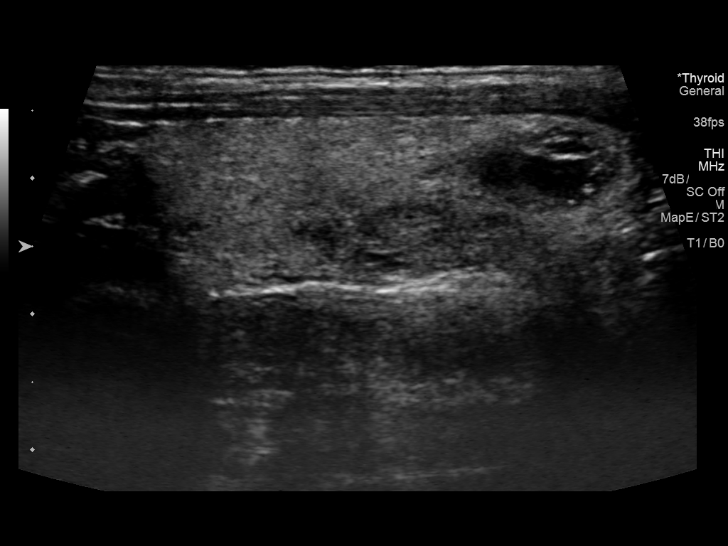
[im 60/63]
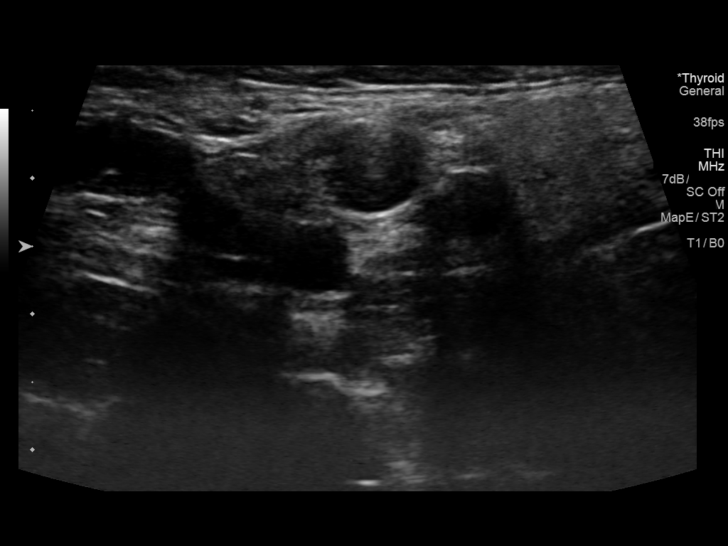

[12 of 25 positions shown; findings below may reference images not displayed]

FINDINGS: Parenchymal Echotexture: Moderately heterogenous

Isthmus: 0.7 cm

Right lobe: 5.3 cm x 1.3 cm x 2.0 cm

Left lobe: 5.4 cm x 1.6 cm x 1.7 cm

_________________________________________________________

Estimated total number of nodules >/= 1 cm: 4

Number of spongiform nodules >/=  2 cm not described below (TR1): 0

Number of mixed cystic and solid nodules >/= 1.5 cm not described
below (TR2): 0

_________________________________________________________

Nodule # 1:

Location: Isthmus; Mid

Maximum size: 1.7 cm; Other 2 dimensions: 1.6 cm x 0.5 cm

Composition: solid/almost completely solid (2)

Echogenicity: isoechoic (1)

Shape: not taller-than-wide (0)

Margins: ill-defined (0)

Echogenic foci: none (0)

ACR TI-RADS total points: 3.

ACR TI-RADS risk category: TR4 (4-6 points).

ACR TI-RADS recommendations:

Nodule meets criteria for surveillance

_________________________________________________________

Nodule # 2:

Location: Isthmus; inferior

Maximum size: 0.8 cm; Other 2 dimensions: 0.5 cm x 0.4 cm

Composition: cystic/almost completely cystic (0)

Echogenicity: anechoic (0)

Shape: not taller-than-wide (0)

Margins: smooth (0)

Echogenic foci: none (0)

ACR TI-RADS total points: 0.

ACR TI-RADS risk category: TR1 (0-1 points).

ACR TI-RADS recommendations:

Cystic nodule does not meet criteria for surveillance or biopsy

_________________________________________________________

Nodule # 3:

Location: Right; Superior

Maximum size: 1.0 cm; Other 2 dimensions: 1.1 cm x 0.6 cm

Composition: mixed cystic and solid (1)

Echogenicity: isoechoic (1)

Shape: not taller-than-wide (0)

Margins: smooth (0)

Echogenic foci: none (0)

ACR TI-RADS total points: 2.

ACR TI-RADS risk category: TR2 (2 points).

ACR TI-RADS recommendations:

Cystic nodule does not meet criteria for surveillance or biopsy

_________________________________________________________

Nodule # 4:

Location: Left; Mid

Maximum size: 1.8 cm; Other 2 dimensions: 1.1 cm x 0.9 cm

Composition: spongiform (0)

ACR TI-RADS recommendations:

Spongiform nodule does not meet criteria for surveillance or biopsy

_________________________________________________________

Nodule # 5:

Location: Left; Inferior

Maximum size: 1.4 cm; Other 2 dimensions: 1.1 cm x 0.7 cm

Composition: mixed cystic and solid (1)

Echogenicity: isoechoic (1)

Shape: not taller-than-wide (0)

Margins: smooth (0)

Echogenic foci: none (0)

ACR TI-RADS total points: 2.

ACR TI-RADS risk category: TR2 (2 points).

ACR TI-RADS recommendations:

Cystic nodule does not meet criteria for surveillance or biopsy

_________________________________________________________

No adenopathy
IMPRESSION: Isthmic thyroid nodule (labeled 1) meets criteria for surveillance,
as designated by the newly established ACR TI-RADS criteria.
Surveillance ultrasound study recommended to be performed annually
up to 5 years. Nodule has been unchanged since the study dated
April 12, 2014. It would be reasonable for 1 further year of
surveillance.

No other thyroid nodule meets criteria for biopsy or surveillance,
as designated by the newly established ACR TI-RADS criteria.

Recommendations follow those established by the new ACR TI-RADS
criteria ([HOSPITAL] 5910;[DATE]).

## 2022-09-15 LAB — EXTERNAL GENERIC LAB PROCEDURE: COLOGUARD: NEGATIVE

## 2022-09-15 LAB — COLOGUARD: COLOGUARD: NEGATIVE

## 2023-11-18 ENCOUNTER — Ambulatory Visit (INDEPENDENT_AMBULATORY_CARE_PROVIDER_SITE_OTHER): Payer: Self-pay | Admitting: Surgery

## 2023-11-18 ENCOUNTER — Encounter: Payer: Self-pay | Admitting: Surgery

## 2023-11-18 VITALS — BP 137/84 | HR 72 | Temp 98.6°F | Ht 61.0 in | Wt 151.6 lb

## 2023-11-18 DIAGNOSIS — D1722 Benign lipomatous neoplasm of skin and subcutaneous tissue of left arm: Secondary | ICD-10-CM | POA: Diagnosis not present

## 2023-11-18 DIAGNOSIS — D179 Benign lipomatous neoplasm, unspecified: Secondary | ICD-10-CM

## 2023-11-18 NOTE — Patient Instructions (Addendum)
Our surgery scheduler Britta Mccreedy will call you within 24-48 hours to get you scheduled. If you have not heard from her after 48 hours, please call our office. Have the blue sheet available when she calls to write down important information.   Lipoma Removal  Lipoma removal is a surgical procedure to remove a lipoma, which is a noncancerous (benign) tumor that is made up of fat cells. Most lipomas are small and painless and do not require treatment. They can form in many areas of the body but are most common under the skin of the back, arms, shoulders, buttocks, and thighs. You may need lipoma removal if you have a lipoma that is large, growing, or causing discomfort. Lipoma removal may also be done for cosmetic reasons. Tell a health care provider about: Any allergies you have. All medicines you are taking, including vitamins, herbs, eye drops, creams, and over-the-counter medicines. Any problems you or family members have had with anesthetic medicines. Any bleeding problems you have. Any surgeries you have had. Any medical conditions you have. Whether you are pregnant or may be pregnant. What are the risks? Generally, this is a safe procedure. However, problems may occur, including: Infection. Bleeding. Scarring. Allergic reactions to medicines. Damage to nearby structures or organs, such as damage to nerves or blood vessels near the lipoma. What happens before the procedure? When to Stop Eating and Drinking Follow instructions from your health care provider about what you may eat and drink before your procedure. These may include: 8 hours before your procedure Stop eating most foods. Do not eat meat, fried foods, or fatty foods. Eat only light foods, such as toast or crackers. All liquids are okay except energy drinks and alcohol. 6 hours before your procedure Stop eating. Drink only clear liquids, such as water, clear fruit juice, black coffee, plain tea, and sports drinks. Do not  drink energy drinks or alcohol. 2 hours before your procedure Stop drinking all liquids. You may be allowed to take medicines with small sips of water. If you do not follow your health care provider's instructions, your procedure may be delayed or canceled. Medicines Ask your health care provider about: Changing or stopping your regular medicines. This is especially important if you are taking diabetes medicines or blood thinners. Taking medicines such as aspirin and ibuprofen. These medicines can thin your blood. Do not take these medicines unless your health care provider tells you to take them. Taking over-the-counter medicines, vitamins, herbs, and supplements. General instructions You will have a physical exam. Your health care provider will check the size of the lipoma and whether it can be removed easily. You may have a biopsy and imaging tests, such as X-rays, a CT scan, and an MRI. Do not use any products that contain nicotine or tobacco for at least 4 weeks before the procedure. These products include cigarettes, chewing tobacco, and vaping devices, such as e-cigarettes. If you need help quitting, ask your health care provider. Ask your health care provider: How your surgery site will be marked. What steps will be taken to help prevent infection. These may include: Washing skin with a germ-killing soap. Taking antibiotic medicine. If you will be going home right after the procedure, plan to have a responsible adult: Take you home from the hospital or clinic. You will not be allowed to drive. Care for you for the time you are told. What happens during the procedure?  An IV will be inserted into one of your veins. You will be  given one or more of the following: A medicine to help you relax (sedative). A medicine to numb the area (local anesthetic). A medicine to make you fall asleep (general anesthetic). A medicine that is injected into an area of your body to numb everything  below the injection site (regional anesthetic). An incision will be made into the skin over the lipoma or very near the lipoma. The incision may be made in a natural skin line or crease. Tissues, nerves, and blood vessels near the lipoma will be moved out of the way. The lipoma and the capsule that surrounds it will be separated from the surrounding tissues. The lipoma will be removed. The incision may be closed with stitches (sutures). A bandage (dressing) will be placed over the incision. The procedure may vary among health care providers and hospitals. What happens after the procedure? Your blood pressure, heart rate, breathing rate, and blood oxygen level will be monitored until you leave the hospital or clinic. If you were prescribed an antibiotic medicine, use it as told by your health care provider. Do not stop using the antibiotic even if you start to feel better. If you were given a sedative during the procedure, it can affect you for several hours. Do not drive or operate machinery until your health care provider says that it is safe. Where to find more information OrthoInfo: orthoinfo.aaos.org Summary Before the procedure, follow instructions from your health care provider about eating and drinking, and changing or stopping your regular medicines. This is especially important if you are taking diabetes medicines or blood thinners. After the lipoma is removed, the incision may be closed with stitches (sutures) and covered with a bandage (dressing). If you were given a sedative during the procedure, it can affect you for several hours. Do not drive or operate machinery until your health care provider says that it is safe. This information is not intended to replace advice given to you by your health care provider. Make sure you discuss any questions you have with your health care provider. Document Revised: 09/01/2021 Document Reviewed: 09/01/2021 Elsevier Patient Education  2024  [employer].

## 2023-11-19 ENCOUNTER — Telehealth: Payer: Self-pay | Admitting: Surgery

## 2023-11-19 NOTE — Telephone Encounter (Signed)
 Left message for patient to call, please inform her of the following regarding scheduled surgery with Dr. Everlene Farrier.   Patient has been advised of Pre-Admission date/time, and Surgery date at [facility].  Surgery Date: 11/28/23 Preadmission Testing Date: 11/20/23 (phone 1p-4p)  Also patient will need to call at [phone], between 1-3:00pm the day before surgery, to find out what time to arrive for surgery.

## 2023-11-19 NOTE — Telephone Encounter (Signed)
 Called patient back, she is now informed of all dates regarding surgery.

## 2023-11-20 ENCOUNTER — Encounter
Admission: RE | Admit: 2023-11-20 | Discharge: 2023-11-20 | Disposition: A | Discharge status: Home / Self Care | Source: Ambulatory Visit | Attending: Surgery | Admitting: Surgery

## 2023-11-20 ENCOUNTER — Encounter: Payer: Self-pay | Admitting: Urgent Care

## 2023-11-20 ENCOUNTER — Other Ambulatory Visit: Payer: Self-pay

## 2023-11-20 VITALS — Ht 61.0 in | Wt 150.0 lb

## 2023-11-20 DIAGNOSIS — Z01812 Encounter for preprocedural laboratory examination: Secondary | ICD-10-CM

## 2023-11-20 HISTORY — DX: Other specified postprocedural states: Z98.890

## 2023-11-20 NOTE — Patient Instructions (Addendum)
 Your procedure is scheduled on:   Thursday April 3  Report to the Registration Desk on the 1st floor of the [facility] To find out your arrival time, please call [phone] between 1PM - 3PM on:  Wednesday April 2  If your arrival time is 6:00 am, do not arrive before that time as the [facility] entrance doors do not open until 6:00 am.  REMEMBER: Instructions that are not followed completely may result in serious medical risk, up to and including death; or upon the discretion of your surgeon and anesthesiologist your surgery may need to be rescheduled.  Do not eat food after midnight the night before surgery.  No gum chewing or hard candies.  You may however, drink CLEAR liquids up to 2 hours before you are scheduled to arrive for your surgery. Do not drink anything within 2 hours of your scheduled arrival time.  Clear liquids include: - water  - apple juice without pulp - gatorade (not RED colors) - black coffee or tea (Do NOT add milk or creamers to the coffee or tea) Do NOT drink anything that is not on this list.   One week prior to surgery: STARTING THURSDAY MARCH 27  Stop Anti-inflammatories (NSAIDS) such as Advil, Aleve, Ibuprofen, Motrin, Naproxen, Naprosyn and Aspirin based products such as Excedrin, Goody's Powder, BC Powder. Stop ANY OVER THE COUNTER supplements until after surgery. Multiple Vitamin (MULTIVITAMIN WITH MINERALS)  vitamin C (ASCORBIC ACID)  zinc gluconate  acidophilus (RISAQUAD)   You may however, continue to take Tylenol if needed for pain up until the day of surgery.  Continue taking all of your other prescription medications up until the day of surgery.  ON THE DAY OF SURGERY DO NOT TAKE ANY MEDICATIONS  No Alcohol for 24 hours before or after surgery.   Do not use any "recreational" drugs for at least a week (preferably 2 weeks) before your surgery.  Please be advised that the combination of cocaine and anesthesia may have negative  outcomes, up to and including death. If you test positive for cocaine, your surgery will be cancelled.  On the morning of surgery brush your teeth with toothpaste and water, you may rinse your mouth with mouthwash if you wish. Do not swallow any toothpaste or mouthwash.  Use CHG Soap as directed on instruction sheet.  Do not wear jewelry, make-up, hairpins, clips or nail polish.  For welded (permanent) jewelry: bracelets, anklets, waist bands, etc.  Please have this removed prior to surgery.  If it is not removed, there is a chance that hospital personnel will need to cut it off on the day of surgery.  Do not wear lotions, powders, or perfumes.   Do not shave body hair from the neck down 48 hours before surgery.  Contact lenses, hearing aids and dentures may not be worn into surgery.  Do not bring valuables to the hospital. [facility] is not responsible for any missing/lost belongings or valuables.   Notify your doctor if there is any change in your medical condition (cold, fever, infection).  Wear comfortable clothing (specific to your surgery type) to the hospital. SOMETHING EASY TO PUT ON AFTER SURGERY.   After surgery, you can help prevent lung complications by doing breathing exercises.  Take deep breaths and cough every 1-2 hours.   If you are being discharged the day of surgery, you will not be allowed to drive home. You will need a responsible individual to drive you home and stay with  you for 24 hours after surgery.   If you are taking public transportation, you will need to have a responsible individual with you.  Please call the Pre-admissions Testing Dept. at [phone] if you have any questions about these instructions.  Surgery Visitation Policy:  Patients having surgery or a procedure may have two visitors.  Children under the age of 78 must have an adult with them who is not the patient.  Temporary Visitor Restrictions Due to increasing cases of flu, RSV  and COVID-19: Children ages 59 and under will not be able to visit patients in [facility] hospitals under most circumstances.       Preparing for Surgery with CHLORHEXIDINE GLUCONATE (CHG) Soap  Chlorhexidine Gluconate (CHG) Soap  o An antiseptic cleaner that kills germs and bonds with the skin to continue killing germs even after washing  o Used for showering the night before surgery and morning of surgery  Before surgery, you can play an important role by reducing the number of germs on your skin.  CHG (Chlorhexidine gluconate) soap is an antiseptic cleanser which kills germs and bonds with the skin to continue killing germs even after washing.  Please do not use if you have an allergy to CHG or antibacterial soaps. If your skin becomes reddened/irritated stop using the CHG.  1. Shower the NIGHT BEFORE SURGERY and the MORNING OF SURGERY with CHG soap.  2. If you choose to wash your hair, wash your hair first as usual with your normal shampoo.  3. After shampooing, rinse your hair and body thoroughly to remove the shampoo.  4. Use CHG as you would any other liquid soap. You can apply CHG directly to the skin and wash gently with a scrungie or a clean washcloth.  5. Apply the CHG soap to your body only from the neck down. Do not use on open wounds or open sores. Avoid contact with your eyes, ears, mouth, and genitals (private parts). Wash face and genitals (private parts) with your normal soap.  6. Wash thoroughly, paying special attention to the area where your surgery will be performed.  7. Thoroughly rinse your body with warm water.  8. Do not shower/wash with your normal soap after using and rinsing off the CHG soap.  9. Pat yourself dry with a clean towel.  10. Wear clean pajamas to bed the night before surgery.  12. Place clean sheets on your bed the night of your first shower and do not sleep with pets.  13. Shower again with the CHG soap on the day of surgery prior  to arriving at the hospital.  14. Do not apply any deodorants/lotions/powders.  15. Please wear clean clothes to the hospital.

## 2023-11-20 NOTE — Progress Notes (Signed)
 Patient ID: Monique Fletcher, female   DOB: 08/21/1971, 53 y.o.   MRN: [id number]  HPI Monique Fletcher is a 53 y.o. female here in consultation for a soft tissue mass in the left upper arm near her left shoulder.  She reports that she has had it for about 3 months hide to be getting larger.  Sometimes she experiences some mild discomfort and tenderness.  No significant pain.  No evidence of nerve injury.  No numbness.  Function of the hand is completely normal. She Did have an ultrasound as well as an MRI that I have personally read the report showing evidence of intramuscular mass in her left shoulder consistent with intramuscular lipoma. She Did have a history of lumpectomy of the breast at age 32 She Did see Dr. Fuller Plan at [facility] similar assessment was performed HPI  Past Medical History:  Diagnosis Date   Elevated blood pressure    Had high blood pressure during pregnancy.   GERD (gastroesophageal reflux disease)    Hx of migraines    Multiple thyroid nodules     Past Surgical History:  Procedure Laterality Date   BREAST SURGERY     MISSCARRIAGE     2    Family History  Problem Relation Age of Onset   Hypertension Father    Stroke Maternal Grandfather    Stroke Paternal Grandmother    Diabetes Paternal Grandmother    Diabetes Mother    Sleep apnea Brother    Thyroid disease Maternal Grandmother     Social History Social History   Tobacco Use   Smoking status: Never   Smokeless tobacco: Never  Substance Use Topics   Alcohol use: Yes    Alcohol/week: 2.0 standard drinks of alcohol    Types: 2 Standard drinks or equivalent per week    Comment: OCCASIONALLY   Drug use: No    Allergies  Allergen Reactions   Cephalexin Diarrhea    REACTION: severe diarrhea   Macrobid [Nitrofurantoin Monohyd Macro] Nausea And Vomiting   Metronidazole Nausea And Vomiting    REACTION: nausea and vomiting   Sulfonamide Derivatives Hives and Swelling    REACTION: facial swelling    Current  Outpatient Medications  Medication Sig Dispense Refill   Multiple Vitamin (MULTIVITAMIN WITH MINERALS) TABS tablet Take 1 tablet by mouth daily.     vitamin C (ASCORBIC ACID) 500 MG tablet Take 500 mg by mouth daily.     acidophilus (RISAQUAD) CAPS capsule Take 1 capsule by mouth daily.     hydrocortisone cream 1 % Apply 1 Application topically daily as needed for itching.     zinc gluconate 50 MG tablet Take 50 mg by mouth daily.     No current facility-administered medications for this visit.     Review of Systems Full ROS  was asked and was negative except for the information on the HPI  Physical Exam Blood pressure 137/84, pulse 72, temperature 98.6 F (37 C), temperature source Oral, height 5\' 1"  (1.549 m), weight 151 lb 9.6 oz (68.8 kg), SpO2 99%. CONSTITUTIONAL: NAD. EYES: Pupils are equal, round, Sclera are non-icteric. EARS, NOSE, MOUTH AND THROAT: The oropharynx is clear. The oral mucosa is pink and moist. Hearing is intact to voice. LYMPH NODES:  Lymph nodes in the neck are normal. RESPIRATORY:  Lungs are clear. There is normal respiratory effort, with equal breath sounds bilaterally, and without pathologic use of accessory muscles. CARDIOVASCULAR: Heart is regular without murmurs, gallops, or rubs. GI: The  abdomen is  soft, nontender, and nondistended. There are no palpable masses. There is no hepatosplenomegaly. There are normal bowel sounds in all quadrants. GU: Rectal deferred.   MUSCULOSKELETAL: Normal muscle strength and tone. No cyanosis or edema.   There is evidence of a 6 cm soft tissue mass in the left upper arm close to the shoulder.  Mass is deep and soft SKIN: Turgor is good and there are no pathologic skin lesions or ulcers. NEUROLOGIC: Motor and sensation is grossly normal. Cranial nerves are grossly intact. PSYCH:  Oriented to person, place and time. Affect is normal.  Data Reviewed  I have personally reviewed the patient's imaging, laboratory findings and  medical records.    Assessment/Plan 53 year old female with left enlarging intramuscular lipoma upper arm.  Discussed with patient detail about her disease process.  We have both remembered that this will probably need to be size given some of the symptoms as well as large in nature.  I do think it is probably best to perform this in the OR  so I have more control of the operation.  Initially she was reluctant to have this done under anesthesia and wanted to do it here in the office.  I did explain to her that my preference be to do it in the OR so I will have well-controlled environment  to perform the  surgery Please note that  that I have spent 40 minutes in this encounter including personally reviewing imaging studies, reviewing the records extensively.  Coordinating care, placing orders and performing looking    Sterling Big, MD FACS General Surgeon 11/20/2023, 12:56 PM

## 2023-11-26 ENCOUNTER — Telehealth: Payer: Self-pay | Admitting: Surgery

## 2023-11-26 NOTE — Telephone Encounter (Signed)
 This patient called to cancel surgery for 11/28/23.  Does not wish to reschedule at this time.  Patient cancelling due to the portion she will be responsible for surgery.  Will hold off for now.  Surgery is cancelled at patient's request.

## 2023-11-28 ENCOUNTER — Encounter: Admission: RE | Payer: Self-pay | Source: Ambulatory Visit

## 2023-11-28 ENCOUNTER — Ambulatory Visit: Admission: RE | Admit: 2023-11-28 | Source: Ambulatory Visit | Admitting: Surgery

## 2023-11-28 SURGERY — EXCISION MASS UPPER EXTREMITIES
Anesthesia: General | Site: Arm Upper | Laterality: Left
